# Patient Record
Sex: Female | Born: 1965
Health system: Southern US, Community
[De-identification: ages and names within clinical notes are randomized; demographics above are authoritative.]

## PROBLEM LIST (undated history)

## (undated) DIAGNOSIS — K219 Gastro-esophageal reflux disease without esophagitis: Secondary | ICD-10-CM

## (undated) DIAGNOSIS — M419 Scoliosis, unspecified: Secondary | ICD-10-CM

## (undated) DIAGNOSIS — F419 Anxiety disorder, unspecified: Secondary | ICD-10-CM

## (undated) DIAGNOSIS — Z9889 Other specified postprocedural states: Secondary | ICD-10-CM

## (undated) DIAGNOSIS — L732 Hidradenitis suppurativa: Secondary | ICD-10-CM

## (undated) DIAGNOSIS — I10 Essential (primary) hypertension: Secondary | ICD-10-CM

## (undated) HISTORY — DX: Gastro-esophageal reflux disease without esophagitis: K21.9

## (undated) HISTORY — PX: FRACTURE SURGERY: SHX138

## (undated) HISTORY — DX: Essential (primary) hypertension: I10

## (undated) HISTORY — PX: OTHER SURGICAL HISTORY: SHX169

## (undated) HISTORY — DX: Scoliosis, unspecified: M41.9

## (undated) HISTORY — DX: Anxiety disorder, unspecified: F41.9

## (undated) HISTORY — DX: Hidradenitis suppurativa: L73.2

---

## 2000-05-21 ENCOUNTER — Encounter: Payer: Self-pay | Admitting: *Deleted

## 2000-05-21 ENCOUNTER — Ambulatory Visit (HOSPITAL_COMMUNITY): Admission: RE | Admit: 2000-05-21 | Discharge: 2000-05-21 | Payer: Self-pay | Admitting: *Deleted

## 2000-08-08 ENCOUNTER — Encounter: Payer: Self-pay | Admitting: Emergency Medicine

## 2000-08-08 ENCOUNTER — Inpatient Hospital Stay (HOSPITAL_COMMUNITY): Admission: EM | Admit: 2000-08-08 | Discharge: 2000-08-09 | Payer: Self-pay | Admitting: Emergency Medicine

## 2000-10-15 ENCOUNTER — Encounter (HOSPITAL_COMMUNITY): Admission: RE | Admit: 2000-10-15 | Discharge: 2000-11-14 | Payer: Self-pay | Admitting: Orthopaedic Surgery

## 2000-11-19 ENCOUNTER — Encounter (HOSPITAL_COMMUNITY): Admission: RE | Admit: 2000-11-19 | Discharge: 2000-12-19 | Payer: Self-pay | Admitting: Orthopaedic Surgery

## 2002-06-15 ENCOUNTER — Encounter: Payer: Self-pay | Admitting: Internal Medicine

## 2002-06-15 ENCOUNTER — Inpatient Hospital Stay (HOSPITAL_COMMUNITY): Admission: EM | Admit: 2002-06-15 | Discharge: 2002-06-16 | Payer: Self-pay | Admitting: Internal Medicine

## 2005-09-05 ENCOUNTER — Ambulatory Visit: Payer: Self-pay | Admitting: Psychiatry

## 2005-09-19 ENCOUNTER — Ambulatory Visit (HOSPITAL_COMMUNITY): Payer: Self-pay | Admitting: Psychiatry

## 2005-10-17 ENCOUNTER — Ambulatory Visit (HOSPITAL_COMMUNITY): Payer: Self-pay | Admitting: Psychiatry

## 2005-11-16 ENCOUNTER — Ambulatory Visit (HOSPITAL_COMMUNITY): Payer: Self-pay | Admitting: Psychiatry

## 2005-12-14 ENCOUNTER — Ambulatory Visit (HOSPITAL_COMMUNITY): Payer: Self-pay | Admitting: Psychiatry

## 2006-02-20 ENCOUNTER — Ambulatory Visit (HOSPITAL_COMMUNITY): Payer: Self-pay | Admitting: Psychiatry

## 2006-03-22 ENCOUNTER — Ambulatory Visit (HOSPITAL_COMMUNITY): Payer: Self-pay | Admitting: Psychiatry

## 2006-04-19 ENCOUNTER — Ambulatory Visit (HOSPITAL_COMMUNITY): Payer: Self-pay | Admitting: Psychiatry

## 2006-08-09 ENCOUNTER — Ambulatory Visit (HOSPITAL_COMMUNITY): Payer: Self-pay | Admitting: Psychiatry

## 2006-09-06 ENCOUNTER — Ambulatory Visit (HOSPITAL_COMMUNITY): Payer: Self-pay | Admitting: Psychiatry

## 2007-01-10 ENCOUNTER — Ambulatory Visit (HOSPITAL_COMMUNITY): Payer: Self-pay | Admitting: Psychiatry

## 2007-09-10 ENCOUNTER — Ambulatory Visit (HOSPITAL_COMMUNITY): Payer: Self-pay | Admitting: Psychiatry

## 2007-11-12 ENCOUNTER — Ambulatory Visit (HOSPITAL_COMMUNITY): Payer: Self-pay | Admitting: Psychiatry

## 2009-09-03 ENCOUNTER — Emergency Department (HOSPITAL_COMMUNITY): Admission: EM | Admit: 2009-09-03 | Discharge: 2009-09-03 | Payer: Self-pay | Admitting: Orthopedic Surgery

## 2010-11-25 NOTE — Op Note (Signed)
[place]  Patient:    Patty White, Patty White                        MRN: [id number] Proc. Date: 08/08/00 Adm. Date:  [id number] Attending:  Jacki Cones                           Operative Report  PREOPERATIVE DIAGNOSIS:  Right elbow fracture-dislocation with comminuted ulna fracture, radial head fracture, and radial head dislocation.  POSTOPERATIVE DIAGNOSIS:  Right elbow fracture-dislocation with comminuted ulna fracture, radial head fracture, and radial head dislocation.  PROCEDURE:  Open reduction and internal fixation, right comminuted proximal ulna, open reduction and internal fixation radial head, and closed reduction radial head.  SURGEON:  Mark C. Ophelia Charter, M.D.  ANESTHESIA:  GOT plus Marcaine skin local.  TOURNIQUET TIME:  88 minutes.  DRAINS:  One Hemovac.  DESCRIPTION OF PROCEDURE:  After induction of general anesthesia and orotracheal intubation, patient was placed prone on chest rolls with careful padding and positioning.  Arm board was placed at the side.  Proximal tourniquet was applied and sterile drape and Duraprep, impervious stockinette, Coban.  Arm was wrapped in an Esmarch, tourniquet was inflated.  Midline incision was made posteriorly.  Preoperative Ancef was given before the case and then _____ for several minutes prior to tourniquet inflation.  The subcutaneous tissue was sharply dissected, small bleeders controlled with the Bovie electrocautery.  Subperiosteal dissection on the distal ulna was performed, following the ulna directly along its ulnar border and subperiosteal dissection for exposure.  There were multiple fragments medial and lateral with extreme comminution, and there were 11 pieces counted from the mid-ulna to the proximal tip of the olecranon.  Fortunately, the proximal tip of the olecranon was a rather large piece that was grasped with a towel clip, and piece by piece the ulna was assembled using  0.045 K-wires to hole piece to piece until gradually the ulna was put back in near-anatomic position.  There was a slight displacement of one ulnar fragment, which was anterior and distal.  On the posterior cortex, medial and lateral cortex, it was anatomic, and an eight-hole 3.5 compression plate was placed directly on the ulnar border and proximal and distal screws were placed in neutral using the Green drill guide with cortical screws distally and some cancellous screws proximally.  Interfragmentary screws were used to lag the coronoid process, and some screws were placed from medial to lateral with care taken that they were distal enough so that they would not bother the ulnar nerve, and these were also used to lag the coronoid fragment.  The entire _____ was checked under AP and lateral fluoroscopy.  The radial head was not only dislocated but also had a fracture.  With the extremes of rotation through the operative field along the radial aspect of the ulna, the radial head was visualized where the capsule had been torn.  With hyperpronation, the fracture was identified and using a transulnar pinning technique, the fracture was pinned and then the pin was cut off flush with the cortex of the bone, checked under fluoroscopy, and was in good position.  The pin was ticking off slightly far on the opposite radial side, and the arm was rotated around and the pin was cut at this surface, and then the arm was rotated with pronation-supination with no rub and good position with the elbow flexed.  The  radial head was well-reduced.  With extension, the radial head would sublux posteriorly.  This was easily visualized and with palpation, the radial head would sublux in and out.  After all K-wires had been removed and all interfragmentary and screws were hand-tightened securely, repeated irrigation was used, and periosteum and fascia were closed with 2-0 Vicryl interrupted.  Triceps that was peeled  was also reapproximated over the plate as it attached to the periosteum. Subcutaneous tissue closed with 2-0 Vicryl, Marcaine infiltration of the skin, skin staple closure, application of soft dressing.  A Hemovac was placed prior to closure of the subcutaneous tissue to prevent postoperative hematoma formation.  Radial nerve was palpated in its groove and was normal, and with the elbow flexed at 90 degrees supination-pronation, the radial head was well-reduced in midposition, and it was splinted in this position in 90 degrees flexion, neutral position, pronation-supination, with 4 x 15s, Webril, and Ace wrap.  The patient was transferred to recovery in stable condition, tourniquet time was 88 minutes, was deflated prior to closure. DD:  08/08/00 TD:  08/09/00 Job: [id number]

## 2010-11-25 NOTE — Discharge Summary (Signed)
NAMENOELIA, LENART NO.:  [id number]   MEDICAL RECORD NO.:  [id number]                   PATIENT TYPE:  INP   LOCATION:  A304                                 FACILITY:  [facility]   PHYSICIAN:  Sarita Bottom, M.D.                  DATE OF BIRTH:  1965/12/28   DATE OF ADMISSION:  06/15/2002  DATE OF DISCHARGE:  06/16/2002                                 DISCHARGE SUMMARY   HOSPITAL COURSE:  The patient is a 45 year old lady with a history of  asthma.  She was admitted on June 15, 2002 when she presented to the  emergency room with a complaint of fever, nausea, vomiting, and a cough.  Physical examination on admission was significant for reduced air entry  bilaterally, bilateral expiratory wheezes, and rales - more on the right  side than on the left.  Her admission laboratory tests showed a sodium of  128, potassium 3.2.  Her chest x-ray was significant for a right lower lobe  infiltrate and her UA also showed positive nitrite, positive LE.  The  patient was admitted with an impression of possible community-acquired  pneumonia, UTI, asthma, and influenza; also hyponatremia, hypokalemia, and  anxiety and depression.  She was started with IV Levaquin.  She was  continued on albuterol nebulizers and she was given Flovent MDI.  She was  also given Ativan and IV hydration and IV KCl.  Her clinical course was  significant for improvement in her general medical condition.  Nausea and  vomiting is improved.  She was seen on rounds today.  She had no new  complaints.  She feels much better.   PHYSICAL EXAMINATION:  VITAL SIGNS:  Blood pressure 120/74, temperature 98.0  degrees Fahrenheit.  CHEST:  Clear to auscultation.  CARDIOVASCULAR:  Heart sounds 1 and 2 are normal with regular rhythm and  rate.  ABDOMEN:  Benign.   LABORATORY DATA:  Her latest laboratory shows a wbc of 8.1, neutrophil 70%,  lymphocyte 21%, hemoglobin 9.8, hematocrit 29.5.  Sodium 156,  potassium 3.5.   DISCHARGE DIAGNOSES:  1. Community-acquired pneumonia.  2. Urinary tract infection.  3. Influenza.  4. Asthma.  5. Anxiety and depression.   MEDICATIONS:  The patient will be discharged home on:  1. Oral Levaquin 500 q.d.  2. Continue Tylenol 650 q.6h. p.r.n.  3. Tussionex 30 cc q.12h.  4. Continue albuterol and Flovent MDI.  5. For anxiety, continue with Celexa 20 mg q.d. and Ativan 1 mg q.6h. p.r.n.   DISPOSITION:  The patient will be discharged home to follow up with primary  M.D. within one to two weeks.    DISCHARGE CONDITION:  Stable and satisfactory.   DISPOSITION:  Sarita Bottom, M.D.    DW/MEDQ  D:  06/16/2002  T:  06/16/2002  Job:  [id number]

## 2010-11-25 NOTE — H&P (Signed)
NAMEEUNIQUE, BALIK NO.:  [id number]   MEDICAL RECORD NO.:  [id number]                   PATIENT TYPE:  INP   LOCATION:  A304                                 FACILITY:  [facility]   PHYSICIAN:  Hanley Hays. Dechurch, M.D.           DATE OF BIRTH:  August 13, 1965   DATE OF ADMISSION:  06/15/2002  DATE OF DISCHARGE:                                HISTORY & PHYSICAL   HISTORY OF PRESENT ILLNESS:  A 45 year old with history of asthma who  developed cough, fever, nausea, and vomiting about 3-4 days prior to  presentation.  She complains of being short of breath and weak.  Chest wall  pain secondary to cough but unable to maintain any p.o.  Her primary M.D. is  [facility] in [place].  In the emergency room, she was noted to be  quite uncomfortable.  Chest x-ray does reveal a right lower lobe infiltrate.  White count elevated at 18 with left shift.  Sodium 128, potassium 3.2.  The  patient has not had a flu vaccine this year.  She has not had similar  symptoms.  She has a history of asthma since a child with intermittent  steroid use although none in the last year or so.  She is currently on a  study for a type of inhaler through [facility].  She has not used the inhaler in the last four days.  She normally uses  albuterol and prior to that was using Advair and albuterol.  The patient is  being admitted to the hospital for treatment and evaluation as indicated.   PAST MEDICAL HISTORY:  1. Asthma since childhood.  2. Multiple extrinsic allergies.  3. Migraine headaches.  4. History of olecranon fracture.  5. C-section x2.   FAMILY HISTORY:  Pertinent for asthma.  Her two children also have multiple  environmental allergies as well.   SOCIAL HISTORY:  She is married.  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  No alcohol or tobacco abuse.   REVIEW OF SYSTEMS:  No GI or GU complaints.   Currently with her menses which  is regular.  No change in weight.  No edema.  No rash.  Headaches are  occasional and actually have been stable recently.  She notes diffuse  myalgias at this time consistent with her presenting illness.   ALLERGIES:  1. IODINE  2. SHELLFISH.   No frank medical allergies as far as she knows.   MEDICAL DECISION MAKING:  1. Albuterol nebulizers p.r.n.  2. MDI which is in a double-blinded study apparently.  3. Celexa 20 mg daily.  4. Migraine medication.   PHYSICAL EXAMINATION:  GENERAL:  A moderately overweight Caucasian female  who is comfortable at rest although with frequent cough.  She tends to  hyperventilate.  Somewhat anxious.  VITAL SIGNS:  Temperature 99 although she feels  much warmer to touch.  NECK:  A few small nodes but none of significance.  LUNGS:  Decreased breath sounds throughout.  She has coarse rales, right  greater than left which are diffuse.  Few rhonchi with expiration.  Respiratory rate prior to cough was about 14.  HEART:  Regular, obscured by respiratory noise, about 90.  ABDOMEN:  Soft, nontender, with active bowel sounds.  EXTREMITIES:  Without clubbing, cyanosis, or edema.  SKIN:  Without rash or lesion.  NEUROLOGIC:  Alert and oriented and appropriate mental exam, nonfocal.   LABORATORY DATA:  As noted above.  In addition, glucose 161.  It should be  noted these are not coming up in the computer.  BUN 7, creatinine 0.9,  albumin 3, alkaline phosphatase 135.  Liver functions normal.  Amylase 29,  lipase 23.  White count 18.9, hemoglobin 4, hematocrit 34, platelets 440, 79  segs, 8 bands.  Urine is pertinent for blood but the patient is having  menses.  Beta hCG is negative.   ASSESSMENT AND PLAN:  1. Community-acquired pneumonia in a patient with asthma with a presentation     consistent with the flu.  Plan to do nasal swab for flu just for indexing     purposes; however, she has received empiric Levaquin which we  will     continue in the hospital, continue albuterol nebulizers and add Flovent,     to consider systemic steroids if the cough is not improved.  The plan was     discussed with the patient.  2. Anxiety.  Continue Celexa, p.r.n. Ativan during this hospital stay.  3. Asthma.  Actually, she does not seem to be in any kind of acute distress     from the standpoint of her asthma although this may prolong her symptoms.   The plan was discussed with the patient, who seems to have reasonable  understanding.                                               Hanley Hays [name], M.D.    FED/MEDQ  D:  06/15/2002  T:  06/15/2002  Job:  [id number]

## 2010-11-25 NOTE — H&P (Signed)
   Patty White, [name] NO.:  [id number]   MEDICAL RECORD NO.:  [id number]                   PATIENT TYPE:  INP   LOCATION:  A304                                 FACILITY:  [facility]   PHYSICIAN:  Hanley Hays. Dechurch, M.D.           DATE OF BIRTH:  Nov 17, 1965   DATE OF ADMISSION:  06/15/2002  DATE OF DISCHARGE:                                HISTORY & PHYSICAL   ADDENDUM:   ASSESSMENT AND PLAN:  1. Increased glucose.  Check hemoglobin A1c.  The patient has no history of     diabetes.  2. Hyponatremia, probably on the basis of prerenal and vomiting and     plus/minus lung disease.  In any event, continue intravenous fluids.  3. Mild hypokalemia.  Again, etiology is probably from loss with her     vomiting.  Replete and monitor.                                               Hanley Hays Patty White, M.D.    FED/MEDQ  D:  06/15/2002  T:  06/15/2002  Job:  [id number]

## 2011-03-31 ENCOUNTER — Emergency Department (HOSPITAL_COMMUNITY)
Admission: EM | Admit: 2011-03-31 | Discharge: 2011-03-31 | Disposition: A | Discharge status: Home / Self Care | Payer: Self-pay | Attending: Emergency Medicine | Admitting: Emergency Medicine

## 2011-03-31 ENCOUNTER — Encounter: Payer: Self-pay | Admitting: *Deleted

## 2011-03-31 DIAGNOSIS — Z760 Encounter for issue of repeat prescription: Secondary | ICD-10-CM | POA: Insufficient documentation

## 2011-03-31 DIAGNOSIS — J45909 Unspecified asthma, uncomplicated: Secondary | ICD-10-CM | POA: Insufficient documentation

## 2011-03-31 MED ORDER — ALBUTEROL SULFATE HFA 108 (90 BASE) MCG/ACT IN AERS
INHALATION_SPRAY | RESPIRATORY_TRACT | Status: AC
  Administered 2011-03-31: 17:00:00
  Filled 2011-03-31: qty 6.7

## 2011-03-31 MED ORDER — PREDNISONE 20 MG PO TABS: 40.0000 mg | ORAL_TABLET | Freq: Every day | ORAL | Status: AC

## 2011-03-31 MED ORDER — PREDNISONE 20 MG PO TABS
40.0000 mg | ORAL_TABLET | Freq: Once | ORAL | Status: AC
  Administered 2011-03-31: 40 mg via ORAL
  Filled 2011-03-31: qty 2

## 2011-03-31 MED ORDER — ALBUTEROL SULFATE HFA 108 (90 BASE) MCG/ACT IN AERS: 1.0000 | INHALATION_SPRAY | Freq: Four times a day (QID) | RESPIRATORY_TRACT | Status: DC | PRN

## 2011-03-31 MED ORDER — ALBUTEROL SULFATE (5 MG/ML) 0.5% IN NEBU
2.5000 mg | INHALATION_SOLUTION | Freq: Once | RESPIRATORY_TRACT | Status: AC
  Administered 2011-03-31: 2.5 mg via RESPIRATORY_TRACT
  Filled 2011-03-31: qty 0.5

## 2011-03-31 MED ORDER — SODIUM CHLORIDE 0.9 % IN NEBU
INHALATION_SOLUTION | RESPIRATORY_TRACT | Status: AC
  Administered 2011-03-31: 3 mL
  Filled 2011-03-31: qty 3

## 2011-03-31 NOTE — ED Notes (Signed)
respiratory therapy paged

## 2011-03-31 NOTE — Discharge Instructions (Signed)
Asthma, Adult Asthma is caused by narrowing of the air passages in the lungs. It may be triggered by pollen, dust, animal dander, molds, some foods, respiratory infections, exposure to smoke, exercise, emotional stress or other allergens (things that cause allergic reactions or allergies). Repeat attacks are common. HOME CARE INSTRUCTIONS  Use prescription medications as ordered by your caregiver.   Avoid pollen, dust, animal dander, molds, smoke and other things that cause attacks at home and at work.   You may have fewer attacks if you decrease dust in your home. Electrostatic air cleaners may help.   It may help to replace your pillows or mattress with materials less likely to cause allergies.   Talk to your caregiver about an action plan for managing asthma attacks at home, including, the use of a peak flow meter which measures the severity of your asthma attack. An action plan can help minimize or stop the attack without having to seek medical care.   If you are not on a fluid restriction, drink 8 to 10 glasses of water each day.   Always have a plan prepared for seeking medical attention, including, calling your physician, accessing local emergency care, and calling 911 (in the [place] for a severe attack.   Discuss possible exercise routines with your caregiver.   If animal dander is the cause of asthma, you may need to get rid of pets.  SEEK MEDICAL CARE IF:  You have wheezing and shortness of breath even if taking medicine to prevent attacks.   An oral temperature above 101 develops.   You have muscle aches, chest pain or thickening of sputum.   Your sputum changes from clear or white to yellow, green, gray or bloody.   You have any problems that may be related to the medicine you are taking (such as a rash, itching, swelling or trouble breathing).  SEEK IMMEDIATE MEDICAL CARE IF:  Your usual medicines do not stop your wheezing or there is increased coughing and/or shortness  of breath.   You have increased difficulty breathing.   You have an oral temperature above 101, not controlled by medicine.  MAKE SURE YOU:  Understand these instructions.   Will watch your condition.   Will get help right away if you are not doing well or get worse.  Document Released: 06/26/2005 Document Re-Released: 07/18/2009 [facility] Patient Information 2011 [place], [place].      Take the albuterol and prednisone as directed.  Follow up with your MD as needed.

## 2011-03-31 NOTE — ED Provider Notes (Signed)
History     CSN: [id number] Arrival date & time: 03/31/2011  2:41 PM  Chief Complaint  Patient presents with  . Shortness of Breath    HPI  (Consider location/radiation/quality/duration/timing/severity/associated sxs/prior treatment)  HPI Comments: Pt has no insurance and can't afford to see her MD to renew her albuterol rx.  Patient is a 45 y.o. female presenting with shortness of breath. The history is provided by the patient. No language interpreter was used.  Shortness of Breath  Episode onset: several months ago. The onset was gradual. The problem has been unchanged. The symptoms are relieved by nothing. The symptoms are aggravated by nothing. Associated symptoms include shortness of breath and wheezing. She has not inhaled smoke recently. She has had intermittent steroid use. She has had no prior hospitalizations. She has had no prior ICU admissions. She has had no prior intubations. Her past medical history is significant for asthma and asthma in the family. She has been behaving normally. Urine output has been normal. The last void occurred less than 6 hours ago. There were no sick contacts. She has received no recent medical care.    Past Medical History  Diagnosis Date  . Asthma     Past Surgical History  Procedure Date  . Cesarean section     History reviewed. No pertinent family history.  History  Substance Use Topics  . Smoking status: Never Smoker   . Smokeless tobacco: Not on file  . Alcohol Use: Yes     occasionally    OB History    Grav Para Term Preterm Abortions TAB SAB Ect Mult Living                  Review of Systems  Review of Systems  Respiratory: Positive for chest tightness, shortness of breath and wheezing.   All other systems reviewed and are negative.    Allergies  Iodine and Other  Home Medications   Current Outpatient Rx  Name Route Sig Dispense Refill  . PRIMATENE ASTHMA PO Oral Take 2 tablets by mouth 4 (four) times daily as  needed. Shortness of Breath     . IBUPROFEN 200 MG PO TABS Oral Take 600 mg by mouth every 6 (six) hours as needed. Pain     . OVER THE COUNTER MEDICATION Oral Take 2 tablets by mouth daily. Heartburn Medication     . ALBUTEROL SULFATE HFA 108 (90 BASE) MCG/ACT IN AERS Inhalation Inhale 1-2 puffs into the lungs every 6 (six) hours as needed for wheezing. 1 Inhaler 0    Physical Exam    BP 118/79  Pulse 100  Temp(Src) 97.9 F (36.6 C) (Oral)  Resp 18  Ht 5\' 8"  (1.727 m)  Wt 154 lb (69.854 kg)  BMI 23.42 kg/m2  SpO2 100%  LMP 03/09/2011  Physical Exam  Nursing note and vitals reviewed. Constitutional: She is oriented to person, place, and time. She appears well-developed and well-nourished. No distress.  HENT:  Head: Normocephalic and atraumatic.  Eyes: EOM are normal.  Neck: Normal range of motion.  Cardiovascular: Normal rate, regular rhythm and normal heart sounds.  Exam reveals no gallop and no friction rub.   No murmur heard. Pulmonary/Chest: Effort normal. No respiratory distress. She has wheezes. She has no rales. She exhibits no tenderness.  Abdominal: Soft. She exhibits no distension. There is no tenderness.  Musculoskeletal: Normal range of motion.  Neurological: She is alert and oriented to person, place, and time.  Skin: Skin is warm  and dry. She is not diaphoretic.  Psychiatric: She has a normal mood and affect. Judgment normal.    ED Course  Procedures (including critical care time)  Labs Reviewed - No data to display No results found.   No diagnosis found.   MDM         Worthy Rancher, PA 03/31/11 [phone]

## 2011-03-31 NOTE — ED Notes (Signed)
Pt c/o sob x 1 week. Pt states she has asthma and has had mild rxn. Pt states she has been out of her inhaler for a long time. Pt states she has tried otc meds w/o relief.

## 2011-03-31 NOTE — ED Notes (Signed)
RT returned call and on the way for neb treatment.

## 2011-04-16 NOTE — ED Provider Notes (Signed)
Medical screening examination/treatment/procedure(s) were performed by non-physician practitioner and as supervising physician I was immediately available for consultation/collaboration.  Donnetta Hutching, MD 04/16/11 1536

## 2011-09-11 ENCOUNTER — Encounter (HOSPITAL_COMMUNITY): Payer: Self-pay | Admitting: *Deleted

## 2011-09-11 ENCOUNTER — Emergency Department (HOSPITAL_COMMUNITY)
Admission: EM | Admit: 2011-09-11 | Discharge: 2011-09-11 | Disposition: A | Discharge status: Home / Self Care | Payer: Self-pay | Attending: Emergency Medicine | Admitting: Emergency Medicine

## 2011-09-11 DIAGNOSIS — K0889 Other specified disorders of teeth and supporting structures: Secondary | ICD-10-CM

## 2011-09-11 DIAGNOSIS — K089 Disorder of teeth and supporting structures, unspecified: Secondary | ICD-10-CM | POA: Insufficient documentation

## 2011-09-11 DIAGNOSIS — J45909 Unspecified asthma, uncomplicated: Secondary | ICD-10-CM | POA: Insufficient documentation

## 2011-09-11 MED ORDER — OXYCODONE-ACETAMINOPHEN 5-325 MG PO TABS
2.0000 | ORAL_TABLET | Freq: Once | ORAL | Status: AC
  Administered 2011-09-11: 2 via ORAL
  Filled 2011-09-11: qty 2

## 2011-09-11 MED ORDER — BUPIVACAINE HCL (PF) 0.5 % IJ SOLN
10.0000 mL | Freq: Once | INTRAMUSCULAR | Status: AC
  Administered 2011-09-11: 10 mL
  Filled 2011-09-11: qty 30

## 2011-09-11 MED ORDER — IBUPROFEN 400 MG PO TABS
400.0000 mg | ORAL_TABLET | Freq: Once | ORAL | Status: AC
  Administered 2011-09-11: 400 mg via ORAL
  Filled 2011-09-11: qty 1

## 2011-09-11 MED ORDER — IBUPROFEN 400 MG PO TABS: 400.0000 mg | ORAL_TABLET | Freq: Four times a day (QID) | ORAL | Status: AC | PRN

## 2011-09-11 MED ORDER — OXYCODONE-ACETAMINOPHEN 5-325 MG PO TABS: 1.0000 | ORAL_TABLET | ORAL | Status: AC | PRN

## 2011-09-11 NOTE — ED Notes (Signed)
Pt reports she needs to have a root canal for bottom left molar, reports pain has become severe over the past couple of days

## 2011-09-11 NOTE — ED Provider Notes (Signed)
History    46 year old female presenting with a toothache. Left lower molar. This has been ongoing issue for patient and she is aware of the need for root canal. She reports in the past 2 days the pain has gotten appreciably worse though. Denies trauma. No significant pain anywhere else. No difficulty breathing or swallowing. No fevers or chills. Denies history of diabetes. Has not recently seen a dentist for the same complaint  CSN: [id number]  Arrival date & time 09/11/11  0147   First MD Initiated Contact with Patient 09/11/11 0230      Chief Complaint  Patient presents with  . Dental Pain    (Consider location/radiation/quality/duration/timing/severity/associated sxs/prior treatment) HPI  Past Medical History  Diagnosis Date  . Asthma     Past Surgical History  Procedure Date  . Cesarean section     No family history on file.  History  Substance Use Topics  . Smoking status: Never Smoker   . Smokeless tobacco: Not on file  . Alcohol Use: Yes     occasionally    OB History    Grav Para Term Preterm Abortions TAB SAB Ect Mult Living                  Review of Systems   Review of symptoms negative unless otherwise noted in HPI.   Allergies  Iodine and Other  Home Medications   Current Outpatient Rx  Name Route Sig Dispense Refill  . ALBUTEROL SULFATE HFA 108 (90 BASE) MCG/ACT IN AERS Inhalation Inhale 1-2 puffs into the lungs every 6 (six) hours as needed for wheezing. 1 Inhaler 0  . ALBUTEROL SULFATE HFA 108 (90 BASE) MCG/ACT IN AERS Inhalation Inhale 1-2 puffs into the lungs every 6 (six) hours as needed for wheezing. 1 Inhaler 0  . PRIMATENE ASTHMA PO Oral Take 2 tablets by mouth 4 (four) times daily as needed. Shortness of Breath     . IBUPROFEN 200 MG PO TABS Oral Take 600 mg by mouth every 6 (six) hours as needed. Pain     . OVER THE COUNTER MEDICATION Oral Take 2 tablets by mouth daily. Heartburn Medication       BP 126/79  Pulse 69  Temp(Src)  97.5 F (36.4 C) (Oral)  Resp 20  Ht 5\' 8"  (1.727 m)  Wt 153 lb (69.4 kg)  BMI 23.26 kg/m2  SpO2 99%  LMP 09/04/2011  Physical Exam  Nursing note and vitals reviewed. Constitutional: She appears well-developed and well-nourished. No distress.  HENT:  Head: Normocephalic and atraumatic.  Mouth/Throat: Oropharynx is clear and moist.       Left lower second molar is cracked. There is mild gingivitis but no discrete abscess. Patient is handling her secretions. There is no tongue elevation. Submental tissues are soft. Neck is supple and  without adenopathy.  Eyes: Conjunctivae are normal. Right eye exhibits no discharge. Left eye exhibits no discharge.  Neck: Neck supple.  Cardiovascular: Normal rate, regular rhythm and normal heart sounds.  Exam reveals no gallop and no friction rub.   No murmur heard. Pulmonary/Chest: Effort normal and breath sounds normal. No respiratory distress.  Musculoskeletal: She exhibits no edema and no tenderness.  Neurological: She is alert.  Skin: Skin is warm and dry.  Psychiatric: She has a normal mood and affect. Her behavior is normal. Thought content normal.    ED Course  NERVE BLOCK Date/Time: 09/11/2011 3:00 AM Performed by: Raeford Razor Authorized by: Raeford Razor Consent: Verbal consent obtained.  Written consent not obtained. Risks and benefits: risks, benefits and alternatives were discussed Consent given by: patient Patient identity confirmed: verbally with patient and provided demographic data Indications: pain relief Body area: face/mouth Nerve: inferior alveolar Laterality: left Patient sedated: no Patient position: sitting Needle gauge: 25 G Location technique: anatomical landmarks Local anesthetic: bupivacaine 0.5% with epinephrine Anesthetic total: 2 ml Outcome: pain improved   (including critical care time)  Labs Reviewed - No data to display No results found.   1. Toothache       MDM    46year-old female with  dental plan. Patient has a crack second lower molar. There is no evidence of discrete drainable collection. Patient is afebrile and clinically well appearing. There is no evidence of deep space infection. Patient was agreeable to nerve block. Inferior alveolar block was performed with good result analgesia.  Dental referral was provided. Outpatient followup.        Raeford Razor, MD 09/18/11 [phone]

## 2011-09-11 NOTE — Discharge Instructions (Signed)
Dental Pain Toothache is pain in or around a tooth. It may get worse with chewing or with cold or heat.  HOME CARE  Your dentist may use a numbing medicine during treatment. If so, you may need to avoid eating until the medicine wears off. Ask your dentist about this.   Only take medicine as told by your dentist or doctor.   Avoid chewing food near the painful tooth until after all treatment is done. Ask your dentist about this.  GET HELP RIGHT AWAY IF:   The problem gets worse or new problems appear.   You have a fever.   There is redness and puffiness (swelling) of the face, jaw, or neck.   You cannot open your mouth.   There is pain in the jaw.   There is very bad pain that is not helped by medicine.  MAKE SURE YOU:   Understand these instructions.   Will watch your condition.   Will get help right away if you are not doing well or get worse.  Document Released: 12/13/2007 Document Revised: 06/15/2011 Document Reviewed: 12/13/2007 [facility] Patient Information 2012 [place], [place].Toothache Toothaches are usually caused by tooth decay (cavity). However, other causes of toothache include:  Gum disease.   Cracked tooth.   Cracked filling.   Injury.   Jaw problem (temporo mandibular joint or TMJ disorder).   Tooth abscess.   Root sensitivity.   Grinding.   Eruption problems.  Swelling and redness around a painful tooth often means you have a dental abscess. Pain medicine and antibiotics can help reduce symptoms, but you will need to see a dentist within the next few days to have your problem properly evaluated and treated. If tooth decay is the problem, you may need a filling or root canal to save your tooth. If the problem is more severe, your tooth may need to be pulled. SEEK IMMEDIATE MEDICAL CARE IF:  You cannot swallow.   You develop severe swelling, increased redness, or increased pain in your mouth or face.   You have a fever.   You cannot open your  mouth adequately.  Document Released: 08/03/2004 Document Revised: 06/15/2011 Document Reviewed: 09/23/2009 [facility] Patient Information 2012 [place], [place].  RESOURCE GUIDE  Dental Problems  Patients with Medicaid: [facility] [phone] [address]                                           [phone] [address] Phone:  [phone]                                                  Phone:  [phone]  If unable to pay or uninsured, contact:  [facility] or [facility]. to become qualified for the adult dental clinic.  Chronic Pain Problems Contact [facility]  [phone] Patients need to be referred by their primary care doctor.  Insufficient Money for Medicine Contact [employer]:  call "211" or [facility] 251-076-9332.  No Primary Care Doctor Call [facility]  [phone] Other agencies that provide inexpensive medical care  [facility]  [phone]    [facility]  [phone]    [facility]  [phone]    [facility]  [phone]    [facility]  [phone]    [facility]  [phone]  Psychological Services [facility]  [phone] [facility]  [phone] [facility]   [phone] (emergency services [phone])  Substance Abuse Resources [facility]  [phone] [facility] [phone] [phone] [facility] [phone] Floydene Flock [phone] Residential & Outpatient Substance Abuse Program  (604) [phone]  Abuse/Neglect [facility] Child Abuse Hotline 713-814-7346 [facility] Child Abuse Hotline 514-232-4564 (After Hours)  Emergency Shelter [facility] [phone]  Maternity Homes [facility] [place] [facility] 352-072-2647 [facility] [phone]  MRSA Hotline #:   (517) 862-9539    [facility] Resources  Free  [facility] [place]     [employer]                          [facility]. [address] [place]                       [address]      [address]  Blondell Reveal Phone:  [phone]                                   Phone:  [phone]                 Phone:  [phone]  [facility] Phone:  [phone]  [facility] Child Abuse Hotline (214) 387-1189 [phone] (After Hours)

## 2011-10-03 ENCOUNTER — Emergency Department (HOSPITAL_COMMUNITY)
Admission: EM | Admit: 2011-10-03 | Discharge: 2011-10-03 | Disposition: A | Discharge status: Home Health | Payer: Self-pay | Attending: Emergency Medicine | Admitting: Emergency Medicine

## 2011-10-03 ENCOUNTER — Encounter (HOSPITAL_COMMUNITY): Payer: Self-pay | Admitting: *Deleted

## 2011-10-03 DIAGNOSIS — K029 Dental caries, unspecified: Secondary | ICD-10-CM | POA: Insufficient documentation

## 2011-10-03 DIAGNOSIS — K0889 Other specified disorders of teeth and supporting structures: Secondary | ICD-10-CM

## 2011-10-03 DIAGNOSIS — J45909 Unspecified asthma, uncomplicated: Secondary | ICD-10-CM | POA: Insufficient documentation

## 2011-10-03 MED ORDER — PENICILLIN V POTASSIUM 500 MG PO TABS: 500.0000 mg | ORAL_TABLET | Freq: Four times a day (QID) | ORAL | Status: AC

## 2011-10-03 MED ORDER — PENICILLIN V POTASSIUM 250 MG PO TABS
500.0000 mg | ORAL_TABLET | Freq: Once | ORAL | Status: DC
  Filled 2011-10-03: qty 2

## 2011-10-03 MED ORDER — HYDROCODONE-ACETAMINOPHEN 5-325 MG PO TABS: 1.0000 | ORAL_TABLET | Freq: Four times a day (QID) | ORAL | Status: AC | PRN

## 2011-10-03 MED ORDER — HYDROCODONE-ACETAMINOPHEN 5-325 MG PO TABS
1.0000 | ORAL_TABLET | Freq: Once | ORAL | Status: DC
  Filled 2011-10-03: qty 1

## 2011-10-03 NOTE — ED Notes (Signed)
Pt DC to home with steady gait and info on dental clinics.

## 2011-10-03 NOTE — ED Provider Notes (Signed)
History     CSN: [id number]  Arrival date & time 10/03/11  1500   First MD Initiated Contact with Patient 10/03/11 1720      Chief Complaint  Patient presents with  . Dental Pain    (Consider location/radiation/quality/duration/timing/severity/associated sxs/prior treatment) Patient is a 46 y.o. female presenting with tooth pain. The history is provided by the patient. No language interpreter was used.  Dental PainThe primary symptoms include mouth pain. Primary symptoms do not include dental injury. Episode onset: hurting for 6 weeks.   The symptoms are unchanged. The symptoms occur constantly.  Additional symptoms include: dental sensitivity to temperature and jaw pain. Additional symptoms do not include: trismus.    Past Medical History  Diagnosis Date  . Asthma     Past Surgical History  Procedure Date  . Cesarean section     No family history on file.  History  Substance Use Topics  . Smoking status: Never Smoker   . Smokeless tobacco: Not on file  . Alcohol Use: Yes     occasionally    OB History    Grav Para Term Preterm Abortions TAB SAB Ect Mult Living                  Review of Systems  HENT: Positive for dental problem.   All other systems reviewed and are negative.    Allergies  Iodine and Other  Home Medications   Current Outpatient Rx  Name Route Sig Dispense Refill  . ALBUTEROL SULFATE HFA 108 (90 BASE) MCG/ACT IN AERS Inhalation Inhale 1-2 puffs into the lungs every 6 (six) hours as needed for wheezing. 1 Inhaler 0  . ASPIRIN-ACETAMINOPHEN-CAFFEINE 250-250-65 MG PO TABS Oral Take 1 tablet by mouth as needed. For pain    . GOODY HEADACHE PO Oral Take 1 packet by mouth as needed. For pain    . IBUPROFEN 200 MG PO TABS Oral Take 600 mg by mouth every 6 (six) hours as needed. Pain     . NAPROXEN SODIUM 220 MG PO TABS Oral Take 220 mg by mouth as needed. For pain    . OMEPRAZOLE 20 MG PO CPDR Oral Take 20 mg by mouth at bedtime.    Marland Kitchen  HYDROCODONE-ACETAMINOPHEN 5-325 MG PO TABS Oral Take 1 tablet by mouth every 6 (six) hours as needed for pain. 20 tablet 0  . PENICILLIN V POTASSIUM 500 MG PO TABS Oral Take 1 tablet (500 mg total) by mouth 4 (four) times daily. 40 tablet 0    BP 147/84  Pulse 80  Temp(Src) 97.9 F (36.6 C) (Oral)  Resp 20  Ht 5\' 8"  (1.727 m)  Wt 153 lb (69.4 kg)  BMI 23.26 kg/m2  SpO2 100%  LMP 09/04/2011  Physical Exam  Nursing note and vitals reviewed. Constitutional: She is oriented to person, place, and time. She appears well-developed and well-nourished. No distress.  HENT:  Head: Normocephalic and atraumatic.  Mouth/Throat: Uvula is midline, oropharynx is clear and moist and mucous membranes are normal. Abnormal dentition. Dental caries present. No uvula swelling.    Eyes: EOM are normal.  Neck: Normal range of motion.  Cardiovascular: Normal rate, regular rhythm and normal heart sounds.   Pulmonary/Chest: Effort normal and breath sounds normal.  Abdominal: Soft. She exhibits no distension. There is no tenderness.  Musculoskeletal: Normal range of motion.  Neurological: She is alert and oriented to person, place, and time.  Skin: Skin is warm and dry.  Psychiatric: She has a  normal mood and affect. Judgment normal.    ED Course  Procedures (including critical care time)  Labs Reviewed - No data to display No results found.   1. Pain, dental       MDM  rx-pen VK, 40 rx hydrocodone, 20 OTC  Ibuprofen TID F/u with dentist of choice.        Worthy Rancher, PA 10/03/11 1733  Worthy Rancher, PA 10/03/11 1736

## 2011-10-03 NOTE — Discharge Instructions (Signed)
Dental Pain A tooth ache may be caused by cavities (tooth decay). Cavities expose the nerve of the tooth to air and hot or cold temperatures. It may come from an infection or abscess (also called a boil or furuncle) around your tooth. It is also often caused by dental caries (tooth decay). This causes the pain you are having. DIAGNOSIS  Your caregiver can diagnose this problem by exam. TREATMENT   If caused by an infection, it may be treated with medications which kill germs (antibiotics) and pain medications as prescribed by your caregiver. Take medications as directed.   Only take over-the-counter or prescription medicines for pain, discomfort, or fever as directed by your caregiver.   Whether the tooth ache today is caused by infection or dental disease, you should see your dentist as soon as possible for further care.  SEEK MEDICAL CARE IF: The exam and treatment you received today has been provided on an emergency basis only. This is not a substitute for complete medical or dental care. If your problem worsens or new problems (symptoms) appear, and you are unable to meet with your dentist, call or return to this location. SEEK IMMEDIATE MEDICAL CARE IF:   You have a fever.   You develop redness and swelling of your face, jaw, or neck.   You are unable to open your mouth.   You have severe pain uncontrolled by pain medicine.  MAKE SURE YOU:   Understand these instructions.   Will watch your condition.   Will get help right away if you are not doing well or get worse.  Document Released: 06/26/2005 Document Revised: 06/15/2011 Document Reviewed: 02/12/2008 [employer] Patient Information 2012 [employer].   Take the meds as directed.  Take ibuprofen up to 800 mg every 8 hrs with food.  Follow up with the dentist of your choice. 

## 2011-10-03 NOTE — ED Notes (Signed)
Pt presents with left lower dental pain x 1 week. No swelling noted.

## 2011-10-07 NOTE — ED Provider Notes (Signed)
Medical screening examination/treatment/procedure(s) were performed by non-physician practitioner and as supervising physician I was immediately available for consultation/collaboration.  Flint Melter, MD 10/07/11 [phone]

## 2012-05-07 ENCOUNTER — Emergency Department (HOSPITAL_COMMUNITY)
Admission: EM | Admit: 2012-05-07 | Discharge: 2012-05-07 | Disposition: A | Discharge status: Home / Self Care | Payer: Self-pay | Attending: Emergency Medicine | Admitting: Emergency Medicine

## 2012-05-07 ENCOUNTER — Encounter (HOSPITAL_COMMUNITY): Payer: Self-pay | Admitting: *Deleted

## 2012-05-07 DIAGNOSIS — R062 Wheezing: Secondary | ICD-10-CM | POA: Insufficient documentation

## 2012-05-07 DIAGNOSIS — X500XXA Overexertion from strenuous movement or load, initial encounter: Secondary | ICD-10-CM | POA: Insufficient documentation

## 2012-05-07 DIAGNOSIS — S5011XA Contusion of right forearm, initial encounter: Secondary | ICD-10-CM

## 2012-05-07 DIAGNOSIS — Y9383 Activity, rough housing and horseplay: Secondary | ICD-10-CM | POA: Insufficient documentation

## 2012-05-07 DIAGNOSIS — R0789 Other chest pain: Secondary | ICD-10-CM | POA: Insufficient documentation

## 2012-05-07 DIAGNOSIS — Y929 Unspecified place or not applicable: Secondary | ICD-10-CM | POA: Insufficient documentation

## 2012-05-07 DIAGNOSIS — J45909 Unspecified asthma, uncomplicated: Secondary | ICD-10-CM | POA: Insufficient documentation

## 2012-05-07 DIAGNOSIS — S5010XA Contusion of unspecified forearm, initial encounter: Secondary | ICD-10-CM | POA: Insufficient documentation

## 2012-05-07 MED ORDER — ALBUTEROL SULFATE HFA 108 (90 BASE) MCG/ACT IN AERS: INHALATION_SPRAY | RESPIRATORY_TRACT | Status: AC

## 2012-05-07 MED ORDER — ALBUTEROL SULFATE HFA 108 (90 BASE) MCG/ACT IN AERS
2.0000 | INHALATION_SPRAY | RESPIRATORY_TRACT | Status: DC | PRN
  Administered 2012-05-07: 2 via RESPIRATORY_TRACT
  Filled 2012-05-07: qty 6.7

## 2012-05-07 NOTE — ED Provider Notes (Signed)
History     CSN: [id number]  Arrival date & time 05/07/12  1553   First MD Initiated Contact with Patient 05/07/12 1624      Chief Complaint  Patient presents with  . Arm Pain    (Consider location/radiation/quality/duration/timing/severity/associated sxs/prior treatment) HPI Comments: Pt states she and her husband were"playing around and he twisted my R arm".  She ORIF to R forearm many years ago.  She notes swelling to ulnar aspect of mid forearm.  Pain with movement.  R hand dominant.  "i don't want any x-rays b/c they are so expensive".  Also, she has asthma and her cat is exacerbating the chest tightness.  Denies cough or fever.    Patient is a 46 y.o. female presenting with arm pain. The history is provided by the patient. No language interpreter was used.  Arm Pain This is a new problem. The current episode started today. The problem occurs constantly. Pertinent negatives include no chills, coughing or fever. Exacerbated by: movement and palpation. She has tried nothing for the symptoms.    Past Medical History  Diagnosis Date  . Asthma     Past Surgical History  Procedure Date  . Cesarean section   . Arm surgery     History reviewed. No pertinent family history.  History  Substance Use Topics  . Smoking status: Never Smoker   . Smokeless tobacco: Not on file  . Alcohol Use: Yes     occasionally    OB History    Grav Para Term Preterm Abortions TAB SAB Ect Mult Living                  Review of Systems  Constitutional: Negative for fever and chills.  Respiratory: Positive for chest tightness and wheezing. Negative for cough and shortness of breath.   Skin: Negative for wound.  All other systems reviewed and are negative.    Allergies  Iodine and Other  Home Medications   Current Outpatient Rx  Name Route Sig Dispense Refill  . ALBUTEROL SULFATE HFA 108 (90 BASE) MCG/ACT IN AERS Inhalation Inhale 1-2 puffs into the lungs every 6 (six) hours as  needed for wheezing. 1 Inhaler 0  . ALBUTEROL SULFATE HFA 108 (90 BASE) MCG/ACT IN AERS  1-2 puffs q 4-6 hrs 1 Inhaler 0  . ASPIRIN-ACETAMINOPHEN-CAFFEINE 250-250-65 MG PO TABS Oral Take 1 tablet by mouth as needed. For pain    . GOODY HEADACHE PO Oral Take 1 packet by mouth as needed. For pain    . IBUPROFEN 200 MG PO TABS Oral Take 600 mg by mouth every 6 (six) hours as needed. Pain     . NAPROXEN SODIUM 220 MG PO TABS Oral Take 220 mg by mouth as needed. For pain    . OMEPRAZOLE 20 MG PO CPDR Oral Take 20 mg by mouth at bedtime.      BP 128/88  Pulse 99  Temp 98.2 F (36.8 C) (Oral)  Resp 18  Ht 5\' 8"  (1.727 m)  Wt 155 lb (70.308 kg)  BMI 23.57 kg/m2  SpO2 100%  LMP 04/23/2012  Physical Exam  Nursing note and vitals reviewed. Constitutional: She is oriented to person, place, and time. She appears well-developed and well-nourished. No distress.  HENT:  Head: Normocephalic and atraumatic.  Eyes: EOM are normal.  Neck: Normal range of motion.  Cardiovascular: Normal rate and regular rhythm.   Pulmonary/Chest: Effort normal. No accessory muscle usage. Not tachypneic. No respiratory distress. She has  no decreased breath sounds. She has wheezes. She has no rhonchi. She has no rales. She exhibits no tenderness.       Scattered wheezes noted.  Abdominal: Soft. She exhibits no distension. There is no tenderness.  Musculoskeletal: Normal range of motion. She exhibits tenderness.       Arms: Neurological: She is alert and oriented to person, place, and time.  Skin: Skin is warm and dry.  Psychiatric: She has a normal mood and affect. Judgment normal.    ED Course  Procedures (including critical care time)  Labs Reviewed - No data to display No results found.   1. Contusion of right forearm   2. Asthma       MDM  Pt does not want an x-ray b/c of cost.  She will return if still having significantl pain in a few days.     Sling, ice, elevation Albuterol HFA given to her  and rx for one more.        Evalina Field, [place] 05/07/12 [phone]

## 2012-05-07 NOTE — ED Notes (Signed)
Pt presents with c/o rt arm  Pain after "horse playing" . No deformity noted. Pt has had previous surgery to Rt arm. Pt has full ROM to rt arm. Pt also has history of asthma, r/t need for inhaler.

## 2012-05-07 NOTE — ED Notes (Signed)
Rt arm swelling and pain after twisted by another person.  Also says her asthma is bothering her a little.

## 2012-05-09 NOTE — ED Provider Notes (Signed)
Medical screening examination/treatment/procedure(s) were performed by non-physician practitioner and as supervising physician I was immediately available for consultation/collaboration.   Tajon Moring M Retaj Hilbun, DO 05/09/12 2104 

## 2013-03-13 ENCOUNTER — Other Ambulatory Visit (HOSPITAL_COMMUNITY): Payer: Self-pay | Admitting: Nurse Practitioner

## 2013-03-13 DIAGNOSIS — Z139 Encounter for screening, unspecified: Secondary | ICD-10-CM

## 2013-03-20 ENCOUNTER — Ambulatory Visit (HOSPITAL_COMMUNITY)
Admission: RE | Admit: 2013-03-20 | Discharge: 2013-03-20 | Disposition: A | Discharge status: Home / Self Care | Payer: Self-pay | Source: Ambulatory Visit | Attending: Nurse Practitioner | Admitting: Nurse Practitioner

## 2013-03-20 DIAGNOSIS — Z139 Encounter for screening, unspecified: Secondary | ICD-10-CM

## 2014-05-31 ENCOUNTER — Emergency Department (HOSPITAL_COMMUNITY)
Admission: EM | Admit: 2014-05-31 | Discharge: 2014-05-31 | Disposition: A | Discharge status: Home / Self Care | Payer: Self-pay | Attending: Emergency Medicine | Admitting: Emergency Medicine

## 2014-05-31 ENCOUNTER — Encounter (HOSPITAL_COMMUNITY): Payer: Self-pay | Admitting: Emergency Medicine

## 2014-05-31 ENCOUNTER — Emergency Department (HOSPITAL_COMMUNITY): Payer: Self-pay

## 2014-05-31 DIAGNOSIS — Z79899 Other long term (current) drug therapy: Secondary | ICD-10-CM | POA: Insufficient documentation

## 2014-05-31 DIAGNOSIS — W1839XA Other fall on same level, initial encounter: Secondary | ICD-10-CM | POA: Insufficient documentation

## 2014-05-31 DIAGNOSIS — Y9389 Activity, other specified: Secondary | ICD-10-CM | POA: Insufficient documentation

## 2014-05-31 DIAGNOSIS — Y9289 Other specified places as the place of occurrence of the external cause: Secondary | ICD-10-CM | POA: Insufficient documentation

## 2014-05-31 DIAGNOSIS — S7001XA Contusion of right hip, initial encounter: Secondary | ICD-10-CM | POA: Insufficient documentation

## 2014-05-31 DIAGNOSIS — Y998 Other external cause status: Secondary | ICD-10-CM | POA: Insufficient documentation

## 2014-05-31 DIAGNOSIS — Z791 Long term (current) use of non-steroidal anti-inflammatories (NSAID): Secondary | ICD-10-CM | POA: Insufficient documentation

## 2014-05-31 DIAGNOSIS — W19XXXA Unspecified fall, initial encounter: Secondary | ICD-10-CM

## 2014-05-31 DIAGNOSIS — J45909 Unspecified asthma, uncomplicated: Secondary | ICD-10-CM | POA: Insufficient documentation

## 2014-05-31 MED ORDER — NAPROXEN 500 MG PO TABS: 500.0000 mg | ORAL_TABLET | Freq: Two times a day (BID) | ORAL | Status: DC

## 2014-05-31 MED ORDER — HYDROCODONE-ACETAMINOPHEN 5-325 MG PO TABS: ORAL_TABLET | ORAL | Status: DC

## 2014-05-31 NOTE — Discharge Instructions (Signed)
Contusion °A contusion is a deep bruise. Contusions happen when an injury causes bleeding under the skin. Signs of bruising include pain, puffiness (swelling), and discolored skin. The contusion may turn blue, purple, or yellow. °HOME CARE  °· Put ice on the injured area. °¨ Put ice in a plastic bag. °¨ Place a towel between your skin and the bag. °¨ Leave the ice on for 15-20 minutes, 03-04 times a day. °· Only take medicine as told by your doctor. °· Rest the injured area. °· If possible, raise (elevate) the injured area to lessen puffiness. °GET HELP RIGHT AWAY IF:  °· You have more bruising or puffiness. °· You have pain that is getting worse. °· Your puffiness or pain is not helped by medicine. °MAKE SURE YOU:  °· Understand these instructions. °· Will watch your condition. °· Will get help right away if you are not doing well or get worse. °Document Released: 12/13/2007 Document Revised: 09/18/2011 Document Reviewed: 05/01/2011 °[employer]® Patient Information ©2015 [employer]. This information is not intended to replace advice given to you by your health care provider. Make sure you discuss any questions you have with your health care provider. ° °Hematoma °A hematoma is a collection of blood. The collection of blood can turn into a hard, painful lump under the skin. Your skin may turn blue or yellow if the hematoma is close to the surface of the skin. Most hematomas get better in a few days to weeks. Some hematomas are serious and need medical care. Hematomas can be very small or very big. °HOME CARE °· Apply ice to the injured area: °¨ Put ice in a plastic bag. °¨ Place a towel between your skin and the bag. °¨ Leave the ice on for 20 minutes, 2-3 times a day for the first 1 to 2 days. °· After the first 2 days, switch to using warm packs on the injured area. °· Raise (elevate) the injured area to lessen pain and puffiness (swelling). You may also wrap the area with an elastic bandage. Make sure the  bandage is not wrapped too tight. °· If you have a painful hematoma on your leg or foot, you may use crutches for a couple days. °· Only take medicines as told by your doctor. °GET HELP RIGHT AWAY IF:  °· Your pain gets worse. °· Your pain is not controlled with medicine. °· You have a fever. °· Your puffiness gets worse. °· Your skin turns more blue or yellow. °· Your skin over the hematoma breaks or starts bleeding. °· Your hematoma is in your chest or belly (abdomen) and you are short of breath, feel weak, or have a change in consciousness. °· Your hematoma is on your scalp and you have a headache that gets worse or a change in alertness or consciousness. °MAKE SURE YOU:  °· Understand these instructions. °· Will watch your condition. °· Will get help right away if you are not doing well or get worse. °Document Released: 08/03/2004 Document Revised: 02/26/2013 Document Reviewed: 12/04/2012 °[employer]® Patient Information ©2015 [employer]. This information is not intended to replace advice given to you by your health care provider. Make sure you discuss any questions you have with your health care provider. ° °

## 2014-05-31 NOTE — ED Provider Notes (Signed)
CSN: [id number]     Arrival date & time 05/31/14  1126 History  This chart was scribed for [name], PA-C with Ward GivensIva L Knapp, MD by Tonye RoyaltyJoshua Chen, ED Scribe. This patient was seen in room APFT23/APFT23 and the patient's care was started at 12:11 PM.    Chief Complaint  Patient presents with  . Hip Pain   The history is provided by the patient. No language interpreter was used.    HPI Comments: Patty White is a 48 y.o. female who presents to the Emergency Department complaining of right hip pain status post falling and landing on it in a parking garage 1 week ago. She reports bruising and a knot to the affected area. She states bruising and swelling began right after falling; she notes pain and swelling has decreased, but the knot is persistent. She states pain is worse with movement, weight bearing, or applied pressure, but has been able to ambulate without difficulty. She states she has been using Goody's Powder, Ibuprofen, Aleve, and Tylenol for her pain. She denies chronic use of OTC pain medications. She has applied ice as well. She denies numbness, fever, chills, back pain or abdominal pain.    Past Medical History  Diagnosis Date  . Asthma    Past Surgical History  Procedure Laterality Date  . Cesarean section    . Arm surgery     Family History  Problem Relation Age of Onset  . Cancer Other    History  Substance Use Topics  . Smoking status: Never Smoker   . Smokeless tobacco: Never Used  . Alcohol Use: Yes     Comment: occasionally   OB History    Gravida Para Term Preterm AB TAB SAB Ectopic Multiple Living   2 2 2       2      Review of Systems  Constitutional: Negative for fever and chills.  Gastrointestinal: Negative for nausea, vomiting and abdominal pain.  Genitourinary: Negative for dysuria, hematuria, flank pain and difficulty urinating.  Musculoskeletal: Positive for myalgias. Negative for neck pain and neck stiffness. Arthralgias: right hip.  Skin:  Negative for color change and wound.       Bruising and knot to right hip  Neurological: Negative for dizziness, weakness and numbness.  All other systems reviewed and are negative.     Allergies  Iodine and Other  Home Medications   Prior to Admission medications   Medication Sig Start Date End Date Taking? Authorizing Provider  albuterol (PROVENTIL HFA;VENTOLIN HFA) 108 (90 BASE) MCG/ACT inhaler Inhale 1-2 puffs into the lungs every 6 (six) hours as needed for wheezing. 03/31/11 03/30/12  Evalina Fieldichard Miller, PA-C  albuterol (PROVENTIL HFA;VENTOLIN HFA) 108 (90 BASE) MCG/ACT inhaler 1-2 puffs q 4-6 hrs 05/07/12   Evalina Fieldichard Miller, PA-C  aspirin-acetaminophen-caffeine (EXCEDRIN EXTRA STRENGTH) [phone] MG per tablet Take 1 tablet by mouth as needed. For pain    Historical Provider, MD  Aspirin-Acetaminophen-Caffeine (GOODY HEADACHE PO) Take 1 packet by mouth as needed. For pain    Historical Provider, MD  ibuprofen (ADVIL,MOTRIN) 200 MG tablet Take 600 mg by mouth every 6 (six) hours as needed. Pain     Historical Provider, MD  naproxen sodium (ANAPROX) 220 MG tablet Take 220 mg by mouth as needed. For pain    Historical Provider, MD  omeprazole (PRILOSEC) 20 MG capsule Take 20 mg by mouth at bedtime.    Historical Provider, MD   BP 167/95 mmHg  Pulse 82  Temp(Src) 97.9 F (  36.6 C) (Oral)  Resp 18  SpO2 100%  LMP 05/31/2014 Physical Exam  Constitutional: She is oriented to person, place, and time. She appears well-developed and well-nourished.  HENT:  Head: Normocephalic and atraumatic.  Eyes: Conjunctivae are normal.  Neck: Normal range of motion. Neck supple.  Cardiovascular: Normal rate, regular rhythm and normal heart sounds.   No murmur heard. Pulmonary/Chest: Effort normal and breath sounds normal. No respiratory distress. She has no wheezes. She has no rales.  Musculoskeletal: Normal range of motion.  Large area of ecchymosis to lateral right hip extending to the posterior  upper leg. Lateral hematoma present.  DP pulses and distal sensation intact Patient has full ROM of the hip.  Compartments of the right LE are soft.   Neurological: She is alert and oriented to person, place, and time. She has normal strength. No sensory deficit. Gait normal.  Skin: Skin is warm and dry.  Psychiatric: She has a normal mood and affect.  Nursing note and vitals reviewed.   ED Course  Procedures (including critical care time)  DIAGNOSTIC STUDIES: Oxygen Saturation is 100% on room air, normal by my interpretation.    COORDINATION OF CARE: 12:19 PM Discussed treatment plan with patient at beside, including x-ray. Counseled her on keeping weight off it and use of crutches. The patient agrees with the plan and has no further questions at this time.   Labs Review Labs Reviewed - No data to display  Imaging Review Dg Hip Complete Right  05/31/2014   CLINICAL DATA:  Larey SeatFell 7 days ago and parking garage, lateral RIGHT hip bruising and swelling  EXAM: RIGHT HIP - COMPLETE 2+ VIEW  COMPARISON:  None  FINDINGS: Osseous mineralization normal.  Probable medication tablet projects over the RIGHT sacrum.  Minimal osteitis pubis.  Hip and SI joints symmetric.  No acute fracture, subluxation, or bone destruction.  Degenerative disc disease changes lower lumbar spine.  Soft tissue swelling laterally at RIGHT hip region.  IMPRESSION: No acute RIGHT hip osseous abnormalities.   Electronically Signed   By: Ulyses SouthwardMark  Boles M.D.   On: 05/31/2014 13:01     EKG Interpretation None      MDM   Final diagnoses:  Fall  Hematoma of hip, right, initial encounter    Pt is ambulatory, has full ROM of the hip.  NV intact, compartments of the right LE are soft.  Pt agrees to ice, rest and close f/u with PMD for recheck.  I personally performed the services described in this documentation, which was scribed in my presence. The recorded information has been reviewed and is accurate.    Josia Cueva L.  Trisha Mangleriplett, PA-C 06/02/14 2036  Ward GivensIva L Knapp, MD 06/08/14 [phone]

## 2014-05-31 NOTE — ED Notes (Addendum)
Patient c/o right hip pain after falling x1 week ago. Patient reports pain constant worse with pressure. Patient ambulatory in triage. Large contusion noted to right hip down right leg. Patient reports "knot" on right hip.

## 2014-05-31 NOTE — Progress Notes (Signed)
ED/CM noted patient did not have health insurance and/or PCP listed in the computer.  Patient was given the [place] resources handout with information on the clinics, food pantries, and the handout for new health insurance sign-up. Provided drug discount card. Patient expressed appreciation for information received.   

## 2015-10-20 ENCOUNTER — Ambulatory Visit (HOSPITAL_COMMUNITY): Payer: 59 | Attending: Orthopedic Surgery

## 2015-10-20 ENCOUNTER — Encounter (HOSPITAL_COMMUNITY): Payer: Self-pay

## 2015-10-20 DIAGNOSIS — M546 Pain in thoracic spine: Secondary | ICD-10-CM | POA: Insufficient documentation

## 2015-10-20 DIAGNOSIS — R293 Abnormal posture: Secondary | ICD-10-CM

## 2015-10-20 DIAGNOSIS — M545 Low back pain, unspecified: Secondary | ICD-10-CM

## 2015-10-20 DIAGNOSIS — M6281 Muscle weakness (generalized): Secondary | ICD-10-CM | POA: Insufficient documentation

## 2015-10-20 NOTE — Patient Instructions (Addendum)
   Quadratus Lumborum stretch/Scoliosis Stretch  Standing, feet together, Lift Lt. arm overhead, grasp Lt. wrist with Rt. hand and gently pull towards the Rt. side to stretch the Lt. Quadratus Lumborum.     Complete 10 times, hold each stretch for 5 sec, 2-3x/day  Try to progress to 3 sets of 30 seconds once tolerable           SCAPULAR RETRACTIONS  Draw your shoulder blades back and down.  Complete 15-20 reps, hold 5 sec and then relax, complete 2-3 sets a day

## 2015-10-20 NOTE — Therapy (Signed)
[facility] [address] [place], [place], [address] Phone: [phone]   Fax:  [phone]  Physical Therapy Evaluation  Patient Details  Name: Patty White MRN: [id number] Date of Birth: 09/23/1965 Referring Provider: Dr. Malachy Chamberuben Torrealba  Encounter Date: 10/20/2015      PT End of Session - 10/20/15 1844    Visit Number 1   Number of Visits 13   Date for PT Re-Evaluation 11/19/15   Authorization Type [employer]    Authorization Time Period 10/20/2015 to 12/01/2015    PT Start Time 1610   PT Stop Time 1648   PT Time Calculation (min) 38 min   Activity Tolerance Patient tolerated treatment well   Behavior During Therapy [facility] for tasks assessed/performed      Past Medical History  Diagnosis Date  . Asthma     Past Surgical History  Procedure Laterality Date  . Cesarean section    . Arm surgery    . Fracture surgery      There were no vitals filed for this visit.       Subjective Assessment - 10/20/15 1617    Subjective Patty White is a 50 yo female who reports a long hx of low back and upper back pain associated with scoliosis, which she has had since she was a child. 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 She reports that she has to stand for long periods of time in order to complete work duties, which aggravates her low and mid back pain. Her goal is to reduce her pain levels in order to complete work duties and recreational activities with less difficulty. She has not received any prior PT for her current complaints.    Pertinent History R forearm fx and scoliosis    Limitations Standing;Walking;Lifting;Other (comment)  Lifting >15#   How long can you sit comfortably? 30-45 minutes    How long can you stand comfortably? 3 hours    How long can you walk comfortably? 30 minutes    Patient Stated Goals Patient's goal is to  reduce her pain and be able to tolerate work related activities.    Currently in Pain? Yes   Pain Score 3   Upper and low back pain ranges between a 0-8/10 on a VAS   Pain Location Back   Pain Orientation Lower;Upper;Right;Left;Posterior   Pain Descriptors / Indicators Aching;Burning   Pain Type Chronic pain   Pain Radiating Towards None    Pain Onset More than a month ago   Pain Frequency Constant   Aggravating Factors  standing, walking, and lifting activities    Pain Relieving Factors Pain meds, lying supine, and cold packs    Effect of Pain on Daily Activities Limited with prolonged standing at work and long distance hiking/walking             [facility] PT [facility] - 10/20/15 0001    Assessment   Medical Diagnosis Low back pain secondary to Scoliosis    Referring Provider Dr. Minda Meouben Torrealba Rodriguez   Onset Date/Surgical Date 05/11/15   Hand Dominance Right   Next MD Visit May 2017   Prior Therapy No prior PT for current complaints    Precautions   Precautions None   Restrictions   Weight Bearing Restrictions No   Balance Screen   Has the patient fallen in the past 6 months No   Has the  patient had a decrease in activity level because of a fear of falling?  No   Is the patient reluctant to leave their home because of a fear of falling?  No   Cognition   Overall Cognitive Status Within Functional Limits for tasks assessed   Observation/Other Assessments   Observations Muscular atrophy of B LE assessed, specifically of gluteal musculature    Focus on Therapeutic Outcomes (FOTO)  51% limited    Sensation   Light Touch Appears Intact   Posture/Postural Control   Posture/Postural Control Postural limitations   Postural Limitations Forward head;Rounded Shoulders;Increased thoracic kyphosis  R sided thoracic scoliosis; compensatory L lumbar scoliosis    ROM / Strength   AROM / PROM / Strength Strength;AROM   AROM   Overall AROM Comments B LE AROM is WNL with muscular  tightness assess of B LE    AROM Assessment Site Lumbar;Thoracic   Lumbar Flexion WFL  pain-free   Lumbar Extension 16 degrees  pain-free   Lumbar - Right Side Bend 22 degrees  pain-free   Lumbar - Left Side Bend 27 degrees   pain-free   Lumbar - Right Rotation WNL  pain-free   Lumbar - Left Rotation 25% limited   pain-free   Strength   Strength Assessment Site Hip;Knee;Ankle;Lumbar   Right/Left Hip Right;Left   Right Hip Flexion 4/5   Right Hip Extension 4-/5   Right Hip ABduction 4/5   Left Hip Flexion 4/5   Left Hip Extension 4-/5   Left Hip ABduction 4/5   Right/Left Knee Right;Left   Right Knee Flexion 4/5   Right Knee Extension 5/5   Left Knee Flexion 4/5   Left Knee Extension 5/5   Right/Left Ankle Left;Right   Right Ankle Dorsiflexion 5/5   Right Ankle Plantar Flexion 5/5   Right Ankle Inversion 5/5   Right Ankle Eversion 5/5   Left Ankle Dorsiflexion 5/5   Left Ankle Plantar Flexion 5/5   Left Ankle Inversion 5/5   Left Ankle Eversion 5/5   Lumbar Flexion 3+/5   Lumbar Extension 3+/5   Flexibility   Soft Tissue Assessment /Muscle Length yes   Hamstrings WNL   Quadriceps 25% limited    ITB 25% limited    Piriformis 25% limited    Quadratus Lumborum Limited, R worse than L due to scoliosis    Palpation   Palpation comment Tenderness to palpation assessed of Upper/Middle/lower traps and qudratus lumborum    Special Tests    Special Tests Leg LengthTest;Lumbar   Lumbar Tests other   Leg length test  --  Pelvic alignment was WNL in supine   other   Findings Positive   Comments Adam's forward bend test                            PT Education - 10/20/15 1843    Education provided Yes   Education Details PT eval findings, use of cryotherapy and moist heat therapy for pain management, and initial HEP   Person(s) Educated Patient   Methods Explanation;Demonstration;Handout   Comprehension Verbalized understanding;Returned demonstration           PT Short Term Goals - 10/20/15 1858    PT SHORT TERM GOAL #1   Title Patient will independently verbalize and demo full understanding with initial HEP.   Time 2   Period Weeks   Status New   PT SHORT TERM GOAL #2   Title Patient  will demo improved postural awareness with min to no cues required in order to reduce stress on the spine thus reducing pain levels.   Time 3   Period Weeks   Status New   PT SHORT TERM GOAL #3   Title Patient will report decreased max thoracolumbar pain to 5/10 on a VAS in order to improve tolerance with work related activities.   Time 3   Period Weeks   Status New           PT Long Term Goals - 10/20/15 1901    PT LONG TERM GOAL #1   Title Patient will independently verbalize and demo full understanding with advanced HEP in order to continue with strengthening and stretchign ther ex once DC from PT.    Time 6   Period Weeks   Status New   PT LONG TERM GOAL #2   Title Patient will report decreased thoracolumbar pain to 0-4/10 on a VAS in order to improve tolerance with long distance ambulation/hiking to > 1 hour.    Time 6   Period Weeks   Status New   PT LONG TERM GOAL #3   Title Patient will demo improved B hip and core strength by 1 MMT grade in order to improve thoracolumbar stability with lifting activities >20# with no pain.    Time 6   Period Weeks   Status New   PT LONG TERM GOAL #4   Title Patient will improve FOTO limitation to <30% in order to progress towards her PLOF with recreational activities.    Time 6   Period Weeks   Status New   PT LONG TERM GOAL #5   Title Patient will demo improved thoracolumbar AROM to WNL in order to improve trunk mobility with work related activities.   Time 6   Period Weeks   Status New               Plan - 10/20/15 1845    Clinical Impression Statement Patty White is a 50 yo female who was referred to outpatient PT secondary to lumbar and upper thoracic spine pain  associated with a long hx of scoliosis. The patient presents with signs and symptoms that are consistent with MD referral of back pain associated with scoliosis. The patient presents with dextroscoliosis at the thoracolumbar junction with levoscoliosis in the lumbar spine that was assessed with the Adam's forward bend test. Upon further examination, the patient presents with core/postural weakness, B hip weakness, kyphotic/scoliotic posture, thoracolumbar pain, impaired flexibility of the quadratus lumborum/pec min and major/piriformis muscles and decreased thoracolumbar AROM into L rotation. The patient would benefit from skilled PT for 2x/week for 6 weeks in order to improve thoracolumbar mobility, flexibility, and strength with recreational and work related activities. The patient is in agreement with proposed PT POC and frequency.    Rehab Potential Good   Clinical Impairments Affecting Rehab Potential Chronic nature of scoliosis and back pain    PT Frequency 2x / week   PT Duration 6 weeks   PT Treatment/Interventions ADLs/Self Care Home Management;Cryotherapy;Electrical Stimulation;Moist Heat;Ultrasound;Therapeutic activities;Therapeutic exercise;Balance training;Neuromuscular re-education;Patient/family education;Taping;Passive range of motion;Manual techniques   PT Next Visit Plan Next visit to focus on quadratus lumborum stretches, R side-bending stretches of the T-spine,hip strengthening, and postural strengthening and re-education.    PT Home Exercise Plan HEP initiated this visit with addition of scap squeezes and Quadratus Lumborum stretch/Scoliosis Stretch   Recommended Other Services None at this time    Consulted  and Agree with Plan of Care Patient      Patient will benefit from skilled therapeutic intervention in order to improve the following deficits and impairments:  Decreased strength, Improper body mechanics, Postural dysfunction, Decreased mobility, Impaired flexibility, Decreased  range of motion, Pain, Increased fascial restricitons  Visit Diagnosis: Bilateral low back pain without sciatica - Plan: PT plan of care cert/re-cert  Pain in thoracic spine - Plan: PT plan of care cert/re-cert  Muscle weakness (generalized) - Plan: PT plan of care cert/re-cert  Abnormal posture - Plan: PT plan of care cert/re-cert     Problem List There are no active problems to display for this patient.   Bonnee Quin, PT, DPT    10/20/2015, 7:08 PM  [facility] [address] [place], [place], [address] Phone: [phone]   Fax:  [phone]  Name: Patty White MRN: [id number] Date of Birth: Sep 13, 1965

## 2015-10-28 ENCOUNTER — Ambulatory Visit (HOSPITAL_COMMUNITY): Payer: 59 | Admitting: Physical Therapy

## 2015-10-28 DIAGNOSIS — M545 Low back pain, unspecified: Secondary | ICD-10-CM

## 2015-10-28 DIAGNOSIS — M6281 Muscle weakness (generalized): Secondary | ICD-10-CM

## 2015-10-28 DIAGNOSIS — M546 Pain in thoracic spine: Secondary | ICD-10-CM

## 2015-10-28 DIAGNOSIS — R293 Abnormal posture: Secondary | ICD-10-CM

## 2015-10-28 NOTE — Therapy (Signed)
[place] [facility] [address] [place], [place], [address] Phone: [phone]   Fax:  [phone]  Physical Therapy Treatment  Patient Details  Name: Patty White MRN: [id number] Date of Birth: 11-Sep-1965 Referring Provider: Dr. Malachy Chamber  Encounter Date: 10/28/2015      PT End of Session - 10/28/15 1752    Visit Number 2   Number of Visits 13   Date for PT Re-Evaluation 11/19/15   Authorization Type [employer]    Authorization Time Period 10/20/2015 to 12/01/2015    PT Start Time 1736   PT Stop Time 1810   PT Time Calculation (min) 34 min   Activity Tolerance Patient tolerated treatment well   Behavior During Therapy [facility] for tasks assessed/performed      Past Medical History  Diagnosis Date  . Asthma     Past Surgical History  Procedure Laterality Date  . Cesarean section    . Arm surgery    . Fracture surgery      There were no vitals filed for this visit.      Subjective Assessment - 10/28/15 1737    Subjective Pt stated she has increased pain following work, has been on feet all day with increased lower back.  Reports compliance with HEP without questions.   Pertinent History R forearm fx and scoliosis    Patient Stated Goals Patient's goal is to reduce her pain and be able to tolerate work related activities.    Currently in Pain? Yes   Pain Score 9    Pain Location Back   Pain Orientation Lower;Upper   Pain Descriptors / Indicators Aching   Pain Type Chronic pain   Pain Radiating Towards None   Pain Onset More than a month ago   Pain Frequency Constant   Aggravating Factors  standing, walking, and lifting activities   Pain Relieving Factors Pain meds, lying supine, and cold packs    Effect of Pain on Daily Activities Limited with prolonged standing at work and long distance hiking/walking             [facility] Adult PT Treatment/Exercise - 10/28/15 0001    Exercises   Exercises Lumbar;Neck   Neck Exercises: Seated   Neck Retraction 10 reps   Shoulder Rolls Backwards;10 reps   Other Seated Exercise scapular retraction 10x5   Lumbar Exercises: Stretches   Lobbyist 3 reps;30 seconds;Limitations   Lobbyist Limitations QL stretch to Rt    Lumbar Exercises: Seated   Other Seated Lumbar Exercises cervcial and scapular retraction 10x 5"   Other Seated Lumbar Exercises Educated on importance of posture with verbal, visual and tactile cueing   Lumbar Exercises: Supine   Ab Set 10 reps;5 seconds   AB Set Limitations verbal and tactile cueing; posterior pelvic tilt   Bent Knee Raise 10 reps;5 seconds   Bent Knee Raise Limitations cueing for stabiltiy   Lumbar Exercises: Sidelying   Other Sidelying Lumbar Exercises Rt sidelying QL stretch with 2 pillows under             PT Short Term Goals - 10/20/15 1858    PT SHORT TERM GOAL #1   Title Patient will independently verbalize and demo full understanding with initial HEP.   Time 2   Period Weeks   Status New   PT SHORT TERM GOAL #2   Title Patient will demo improved postural awareness with min to no cues required in order to reduce stress on  the spine thus reducing pain levels.   Time 3   Period Weeks   Status New   PT SHORT TERM GOAL #3   Title Patient will report decreased max thoracolumbar pain to 5/10 on a VAS in order to improve tolerance with work related activities.   Time 3   Period Weeks   Status New           PT Long Term Goals - 10/20/15 1901    PT LONG TERM GOAL #1   Title Patient will independently verbalize and demo full understanding with advanced HEP in order to continue with strengthening and stretchign ther ex once DC from PT.    Time 6   Period Weeks   Status New   PT LONG TERM GOAL #2   Title Patient will report decreased thoracolumbar pain to 0-4/10 on a VAS in order to improve tolerance with long distance ambulation/hiking to > 1 hour.    Time 6   Period Weeks   Status New   PT  LONG TERM GOAL #3   Title Patient will demo improved B hip and core strength by 1 MMT grade in order to improve thoracolumbar stability with lifting activities >20# with no pain.    Time 6   Period Weeks   Status New   PT LONG TERM GOAL #4   Title Patient will improve FOTO limitation to <30% in order to progress towards her PLOF with recreational activities.    Time 6   Period Weeks   Status New   PT LONG TERM GOAL #5   Title Patient will demo improved thoracolumbar AROM to WNL in order to improve trunk mobility with work related activities.   Time 6   Period Weeks   Status New               Plan - 10/28/15 1817    Clinical Impression Statement Reviewed goals, compliance and assured correct technique and encouraged increased frequency with HEP and copy given to pt.  Pt educated on importance of posture with verbal, visual and tactile cueing to reduce stress on spinal musculature.  Session focus on improving postural awareness and strengthening as well as core strengtheing.  Therapist facilitation for proper form and control with all exercises with multimodal cueing.  End of session pt encouraged to increase frequency with HEP and improve awareness of posture.  Pt reports decreased pain.   Rehab Potential Good   Clinical Impairments Affecting Rehab Potential Chronic nature of scoliosis and back pain    PT Frequency 2x / week   PT Duration 6 weeks   PT Treatment/Interventions ADLs/Self Care Home Management;Cryotherapy;Electrical Stimulation;Moist Heat;Ultrasound;Therapeutic activities;Therapeutic exercise;Balance training;Neuromuscular re-education;Patient/family education;Taping;Passive range of motion;Manual techniques   PT Next Visit Plan Next session continue to focus on postural awareness and strenghtening, core strengthening and proximal musculature strengtheing, QL and R side bending strengthening.      Patient will benefit from skilled therapeutic intervention in order to  improve the following deficits and impairments:  Decreased strength, Improper body mechanics, Postural dysfunction, Decreased mobility, Impaired flexibility, Decreased range of motion, Pain, Increased fascial restricitons  Visit Diagnosis: Bilateral low back pain without sciatica  Pain in thoracic spine  Muscle weakness (generalized)  Abnormal posture     Problem List There are no active problems to display for this patient.  842 Theatre StreetCasey Cockerham, LPTA; CBIS [phone]  Juel BurrowCockerham, Casey Jo 10/28/2015, 6:23 PM  [facility] [address] [place], [place],  [address] Phone: [phone]   Fax:  [phone]  Name: Patty White MRN: [id number] Date of Birth: 09/19/1965

## 2015-11-03 ENCOUNTER — Ambulatory Visit (HOSPITAL_COMMUNITY): Payer: 59

## 2015-11-05 ENCOUNTER — Encounter (HOSPITAL_COMMUNITY): Payer: 59 | Admitting: Physical Therapy

## 2015-11-08 ENCOUNTER — Ambulatory Visit (HOSPITAL_COMMUNITY): Payer: 59 | Admitting: Physical Therapy

## 2015-11-10 ENCOUNTER — Encounter (HOSPITAL_COMMUNITY): Payer: 59

## 2015-11-15 ENCOUNTER — Ambulatory Visit (HOSPITAL_COMMUNITY): Payer: 59 | Admitting: Physical Therapy

## 2015-11-15 ENCOUNTER — Telehealth (HOSPITAL_COMMUNITY): Payer: Self-pay

## 2015-11-15 NOTE — Telephone Encounter (Signed)
11/15/15 patient said that the times we scheduled her for just do not work with her work schedule.... She did reschedule for one day this week while she was off

## 2015-11-17 ENCOUNTER — Encounter (HOSPITAL_COMMUNITY): Payer: 59

## 2015-11-18 ENCOUNTER — Ambulatory Visit (HOSPITAL_COMMUNITY): Payer: 59 | Attending: Orthopedic Surgery | Admitting: Physical Therapy

## 2015-11-18 DIAGNOSIS — R293 Abnormal posture: Secondary | ICD-10-CM | POA: Insufficient documentation

## 2015-11-18 DIAGNOSIS — M546 Pain in thoracic spine: Secondary | ICD-10-CM | POA: Diagnosis present

## 2015-11-18 DIAGNOSIS — M545 Low back pain, unspecified: Secondary | ICD-10-CM

## 2015-11-18 DIAGNOSIS — M6281 Muscle weakness (generalized): Secondary | ICD-10-CM | POA: Diagnosis present

## 2015-11-18 NOTE — Therapy (Signed)
[place] [facility] [address] [place], [place], [address] Phone: [phone]   Fax:  [phone]  Physical Therapy Treatment  Patient Details  Name: Patty FrancoKaren White MRN: [id number] Date of Birth: 02/20/1966 Referring Provider: Dr. Malachy Chamberuben Torrealba  Encounter Date: 11/18/2015      PT End of Session - 11/18/15 1351    Visit Number 3   Number of Visits 13   Date for PT Re-Evaluation 11/19/15   Authorization Type [employer]    Authorization Time Period 10/20/2015 to 12/01/2015    PT Start Time 1305   PT Stop Time 1348   PT Time Calculation (min) 43 min   Activity Tolerance Patient tolerated treatment well   Behavior [facility] assessed/performed      Past Medical History  Diagnosis Date  . Asthma     Past Surgical History  Procedure Laterality Date  . Cesarean section    . Arm surgery    . Fracture surgery      There were no vitals filed for this visit.      Subjective Assessment - 11/18/15 1527    Subjective PT states she has missed alot of appointments due to her work schedule.  Reports she has been completing what was given to her at her last session   currently without pain.    Currently in Pain? No/denies                         [facility] Adult [facility] Treatment/Exercise - 11/18/15 0001    Lumbar Exercises: Standing   Functional Squats 10 reps   Functional Squats Limitations cues for form   Scapular Retraction Both;10 reps;Theraband   Theraband Level (Scapular Retraction) Level 2 (Red)   Row Both;10 reps;Theraband   Theraband Level (Row) Level 2 (Red)   Shoulder Extension Both;10 reps;Theraband   Theraband Level (Shoulder Extension) Level 2 (Red)   Lumbar Exercises: Prone   Single Arm Raise 10 reps   Straight Leg Raise 10 reps   Opposite Arm/Leg Raise 10 reps                PT Education - 11/18/15 1522    Education provided Yes   Education Details updated HEP with theraband  exercises and educated on postural strengthening and importance to scoliolis and aging   Person(s) Educated Patient   Methods Explanation;Handout;Demonstration   Comprehension Verbalized understanding;Returned demonstration;Need further instruction          PT Short Term Goals - 10/20/15 1858    PT SHORT TERM GOAL #1   Title Patient will independently verbalize and demo full understanding with initial HEP.   Time 2   Period Weeks   Status New   PT SHORT TERM GOAL #2   Title Patient will demo improved postural awareness with min to no cues required in order to reduce stress on the spine thus reducing pain levels.   Time 3   Period Weeks   Status New   PT SHORT TERM GOAL #3   Title Patient will report decreased max thoracolumbar pain to 5/10 on a VAS in order to improve tolerance with work related activities.   Time 3   Period Weeks   Status New           PT Long Term Goals - 10/20/15 1901    PT LONG TERM GOAL #1   Title Patient will independently verbalize and demo full understanding with advanced  HEP in order to continue with strengthening and stretchign ther ex once DC from PT.    Time 6   Period Weeks   Status New   PT LONG TERM GOAL #2   Title Patient will report decreased thoracolumbar pain to 0-4/10 on a VAS in order to improve tolerance with long distance ambulation/hiking to > 1 hour.    Time 6   Period Weeks   Status New   PT LONG TERM GOAL #3   Title Patient will demo improved B hip and core strength by 1 MMT grade in order to improve thoracolumbar stability with lifting activities >20# with no pain.    Time 6   Period Weeks   Status New   PT LONG TERM GOAL #4   Title Patient will improve FOTO limitation to <30% in order to progress towards her PLOF with recreational activities.    Time 6   Period Weeks   Status New   PT LONG TERM GOAL #5   Title Patient will demo improved thoracolumbar AROM to WNL in order to improve trunk mobility with work related  activities.   Time 6   Period Weeks   Status New               Plan - 11/18/15 1351    Clinical Impression Statement Progressed postural strengthening this session adding prone exercises and standing theraband exericses.  Pt able to complete with therapist facilitation and noted weakness.  Pt educated on purpose of therex and encoruaged to complete these as instructed at home for optimal beneifts.  Pt has had issues geting to therapy due to her work schedule.  Pt with noted fatigue at end of session . No complaints of pain or dyscomfort at end of session.     Rehab Potential Good   Clinical Impairments Affecting Rehab Potential Chronic nature of scoliosis and back pain    PT Frequency 2x / week   PT Duration 6 weeks   PT Treatment/Interventions ADLs/Self Care Home Management;Cryotherapy;Electrical Stimulation;Moist Heat;Ultrasound;Therapeutic activities;Therapeutic exercise;Balance training;Neuromuscular re-education;Patient/family education;Taping;Passive range of motion;Manual techniques   PT Next Visit Plan Continue to focus on postural and core strengthening.  Add lunges and SLS vectors next session.    PT Home Exercise Plan updated including postural 3 with theraband, standing hip ex and squats.      Patient will benefit from skilled therapeutic intervention in order to improve the following deficits and impairments:  Decreased strength, Improper body mechanics, Postural dysfunction, Decreased mobility, Impaired flexibility, Decreased range of motion, Pain, Increased fascial restricitons  Visit Diagnosis: Bilateral low back pain without sciatica  Pain in thoracic spine  Muscle weakness (generalized)  Abnormal posture     Problem List There are no active problems to display for this patient.   Lurena Nida, PTA/CLT [phone]  11/18/2015, 3:28 PM  [place] [facility] [address] [place], [place], [address] Phone:  [phone]   Fax:  [phone]  Name: Patty White MRN: [id number] Date of Birth: 17-Feb-1966

## 2015-11-22 ENCOUNTER — Ambulatory Visit (HOSPITAL_COMMUNITY): Payer: 59 | Admitting: Physical Therapy

## 2015-11-23 ENCOUNTER — Ambulatory Visit (HOSPITAL_COMMUNITY): Payer: 59 | Admitting: Physical Therapy

## 2015-11-23 DIAGNOSIS — M545 Low back pain, unspecified: Secondary | ICD-10-CM

## 2015-11-23 DIAGNOSIS — M6281 Muscle weakness (generalized): Secondary | ICD-10-CM

## 2015-11-23 DIAGNOSIS — R293 Abnormal posture: Secondary | ICD-10-CM

## 2015-11-23 DIAGNOSIS — M546 Pain in thoracic spine: Secondary | ICD-10-CM

## 2015-11-23 NOTE — Therapy (Signed)
[place], [place], [address] Phone: [phone]   Fax:  [phone]  Physical Therapy Treatment/ reassessement   Patient Details  Name: Patty White MRN: [id number] Date of Birth: 06-02-1966 Referring Provider: Dr. Ivan Croft  Encounter Date: 11/23/2015      PT End of Session - 11/23/15 1533    Visit Number 4   Number of Visits 13   Date for PT Re-Evaluation 12/23/2015   Authorization Type [place] Time Period 10/20/2015 to 12/01/2015    PT Start Time 1304   PT Stop Time 1348   PT Time Calculation (min) 44 min   Activity Tolerance Patient tolerated treatment well   Behavior During Therapy [facility] for tasks assessed/performed      Past Medical History  Diagnosis Date  . Asthma     Past Surgical History  Procedure Laterality Date  . Cesarean section    . Arm surgery    . Fracture surgery      There were no vitals filed for this visit.      Subjective Assessment - 11/23/15 1306    Subjective Pt states that her back hurts all the time all she really needs is pain pills for while she is at work.    Currently in Pain? Yes   Pain Score 3    Pain Location Back   Pain Orientation Lower   Pain Descriptors / Indicators Aching   Pain Type Chronic pain            [facility] PT Assessment - 11/23/15 0001    Assessment   Medical Diagnosis Low back pain secondary to Scoliosis    Onset Date/Surgical Date 05/11/15   Hand Dominance Right   Next MD Visit May 2017   Prior Therapy No prior PT for current complaints    Precautions   Precautions None   Restrictions   Weight Bearing Restrictions No   Cognition   Overall Cognitive Status Within Functional Limits for tasks assessed   Observation/Other Assessments   Observations Muscular atrophy of B LE assessed, specifically of gluteal musculature    Focus on Therapeutic Outcomes (FOTO)  51% limited    Sensation   Light Touch  Appears Intact   Posture/Postural Control   Posture/Postural Control Postural limitations   Postural Limitations Forward head;Rounded Shoulders;Increased thoracic kyphosis  R sided thoracic scoliosis; compensatory L lumbar scoliosis    AROM   Overall AROM Comments B LE AROM is WNL with muscular tightness assess of B LE    Lumbar Flexion WFL  pain-free   Lumbar Extension 30 degrees  was 16    Lumbar - Right Side Bend 22 degrees  pain-free was 22    Lumbar - Left Side Bend 27 degrees   pain-free was 27    Lumbar - Right Rotation WNL  pain-free   Lumbar - Left Rotation 15% limited   was 25% limited    Strength   Right Hip Flexion 5/5  was 4/5   Right Hip Extension 4+/5  was s4-/5    Right Hip ABduction 4/5   Left Hip Flexion 5/5  was 4/5    Left Hip Extension 4+/5  was 4-/5   Left Hip ABduction 4/5   Right Knee Flexion 5/5  was 4/5   Right Knee Extension 5/5   Left Knee Flexion 4/5  was 4/5    Left Knee Extension 5/5   Right Ankle Dorsiflexion 5/5  Right Ankle Plantar Flexion 5/5   Right Ankle Inversion 5/5   Right Ankle Eversion 5/5   Left Ankle Dorsiflexion 5/5   Left Ankle Plantar Flexion 5/5   Left Ankle Inversion 5/5   Left Ankle Eversion 5/5   Lumbar Flexion 3+/5   Lumbar Extension 3+/5   Flexibility   Soft Tissue Assessment /Muscle Length yes   Hamstrings WNL   Quadriceps 25% limited    ITB 25% limited    Piriformis 25% limited    Quadratus Lumborum Limited, R worse than L due to scoliosis    Palpation   Palpation comment Tenderness to palpation assessed of Upper/Middle/lower traps and qudratus lumborum    Special Tests    Special Tests Leg LengthTest;Lumbar   Lumbar Tests other   other   Findings Positive   Comments Adam's forward bend test                      [facility] Adult PT Treatment/Exercise - 11/23/15 0001    Lumbar Exercises: Stretches   Double Knee to Chest Stretch 30 seconds;3 reps   Lower Trunk Rotation 5 reps   Lumbar  Exercises: Sidelying   Other Sidelying Lumbar Exercises side plank B x 5" x 5    Lumbar Exercises: Prone   Single Arm Raise 10 reps   Straight Leg Raise 15 reps   Other Prone Lumbar Exercises I's , Y's and T's x 10    Other Prone Lumbar Exercises scapular retraction with , shoulder extension B with 2# x 10;    Lumbar Exercises: Quadruped   Madcat/Old Horse 5 reps   Opposite Arm/Leg Raise Right arm/Left leg;Left arm/Right leg;5 reps   Plank 10' x 5                 PT Education - 11/23/15 1334    Education Details updated HEP with quadriped exercises    Person(s) Educated Patient   Methods Explanation;Verbal cues;Demonstration   Comprehension Verbalized understanding;Returned demonstration          PT Short Term Goals - 11/23/15 1340    PT SHORT TERM GOAL #1   Title Patient will independently verbalize and demo full understanding with initial HEP.   Time 2   Period Weeks   Status Partially Met   PT SHORT TERM GOAL #2   Title Patient will demo improved postural awareness with min to no cues required in order to reduce stress on the spine thus reducing pain levels.   Time 3   Period Weeks   Status Achieved   PT SHORT TERM GOAL #3   Title Patient will report decreased max thoracolumbar pain to 5/10 on a VAS in order to improve tolerance with work related activities.   Baseline 5-16: has been as high as a 7/10   Time 3   Period Weeks   Status On-going           PT Long Term Goals - 11/23/15 1341    PT LONG TERM GOAL #1   Title Patient will independently verbalize and demo full understanding with advanced HEP in order to continue with strengthening and stretchign ther ex once DC from PT.    Period Weeks   Status Partially Met   PT LONG TERM GOAL #2   Title Patient will report decreased thoracolumbar pain to 0-4/10 on a VAS in order to improve tolerance with long distance ambulation/hiking to > 1 hour.    Time 6   Status  Partially Met   PT LONG TERM GOAL #3    Title Patient will demo improved B hip and core strength by 1 MMT grade in order to improve thoracolumbar stability with lifting activities >20# with no pain.    Time 6   Period Weeks   Status On-going   PT LONG TERM GOAL #4   Title Patient will improve FOTO limitation to <30% in order to progress towards her PLOF with recreational activities.    Time 6   Period Weeks   Status Unable to assess   PT LONG TERM GOAL #5   Title Patient will demo improved thoracolumbar AROM to WNL in order to improve trunk mobility with work related activities.   Time 6   Period Weeks   Status On-going               Plan - 11/23/15 1533    Clinical Impression Statement Ms. Shaheed was reassessed today with improvement noted in strength and slight improvement in ROM but not subjective improvement although the patient admits to not completing her HEP.  Pt was instructed in quadriped exercises.  Ms. Silos will benefit from skilled therapy for higher level core exercises to assist in decreasing her pain and improving her functional  tolerance.    Rehab Potential Good   Clinical Impairments Affecting Rehab Potential Chronic nature of scoliosis and back pain    PT Frequency 2x / week   PT Duration 6 weeks   PT Treatment/Interventions ADLs/Self Care Home Management;Cryotherapy;Electrical Stimulation;Moist Heat;Ultrasound;Therapeutic activities;Therapeutic exercise;Balance training;Neuromuscular re-education;Patient/family education;Taping;Passive range of motion;Manual techniques   PT Next Visit Plan Add lunges and SLS vector next visit as patient needed to be reassessed today.       Patient will benefit from skilled therapeutic intervention in order to improve the following deficits and impairments:  Decreased strength, Improper body mechanics, Postural dysfunction, Decreased mobility, Impaired flexibility, Decreased range of motion, Pain, Increased fascial restricitons  Visit Diagnosis: Bilateral  low back pain without sciatica  Pain in thoracic spine  Muscle weakness (generalized)  Abnormal posture     Problem List There are no active problems to display for this patient.   Rayetta Humphrey, PT CLT [phone] 11/23/2015, 3:39 PM  [place] [address] [place], [place], [address] Phone: [phone]   Fax:  [phone]  Name: Patty White MRN: [id number] Date of Birth: [birth year]

## 2015-11-23 NOTE — Patient Instructions (Signed)
Opposite Arm / Leg Lift (Prone)    Abdomen and head supported, left knee locked, raise leg and 2opposite arm _2___ inches from floor. Repeat __10__ times per set. Do _1___ sets per session. Do ____ sessions per day.  http://orth.exer.us/1114   Copyright  VHI. All rights reserved.  Straight Leg Raise (Prone)    Abdomen and head supported, keep left knee locked and raise leg at hip. Avoid arching low back. Repeat __10__ times per set. Do ___1_ sets per session. Do _2___ sessions per day.  http://orth.exer.us/1112   Copyright  VHI. All rights reserved.  Scapular Retraction (Prone)    Lie with arms at sides. Pinch shoulder blades together and raise arms a few inches from floor. Repeat __10__ times per set. Do ___1_ sets per session. Do __2__ sessions per day.  http://orth.exer.us/954   Copyright  VHI. All rights reserved.  Scapular Retraction (Prone)    Lie with arms above head. Pinch shoulder blades together. Repeat _10___ times per set. Do __1_ sets per session. Do _2__ sessions per day.  http://orth.exer.us/942   Copyright  VHI. All rights reserved.  Upper / Lower Extremity Extension (All-Fours)    Tighten stomach and raise right leg and opposite arm. Keep trunk rigid. Repeat _5-10__ times per set. Do ___1_ sets per session. Do __1__ sessions per day.  http://orth.exer.us/1118   Copyright  VHI. All rights reserved.  Lower Trunk Rotation Stretch    Keeping back flat and feet together, rotate knees to left side. Hold __3__ seconds. Repeat __10__ times per set. Do 1____ sets per session. Do ___2_ sessions per day.  http://orth.exer.us/122   Copyright  VHI. All rights reserved.

## 2015-11-24 ENCOUNTER — Encounter (HOSPITAL_COMMUNITY): Payer: 59

## 2015-11-26 ENCOUNTER — Ambulatory Visit (HOSPITAL_COMMUNITY): Payer: 59 | Admitting: Physical Therapy

## 2015-11-26 ENCOUNTER — Telehealth (HOSPITAL_COMMUNITY): Payer: Self-pay

## 2015-11-26 NOTE — Telephone Encounter (Signed)
11/26/15 pt came in at 4:00 thinking her appt was at that time.... It was 2:30.... She said she was told at her last appt as she was going out the door she was told "see you Friday at 4".

## 2015-11-29 ENCOUNTER — Encounter (HOSPITAL_COMMUNITY): Payer: 59 | Admitting: Physical Therapy

## 2015-11-30 ENCOUNTER — Ambulatory Visit (HOSPITAL_COMMUNITY): Payer: 59

## 2015-11-30 DIAGNOSIS — R293 Abnormal posture: Secondary | ICD-10-CM

## 2015-11-30 DIAGNOSIS — M545 Low back pain, unspecified: Secondary | ICD-10-CM

## 2015-11-30 DIAGNOSIS — M546 Pain in thoracic spine: Secondary | ICD-10-CM

## 2015-11-30 DIAGNOSIS — M6281 Muscle weakness (generalized): Secondary | ICD-10-CM

## 2015-11-30 NOTE — Therapy (Signed)
[facility] [address] [place], [place], [address] Phone: [phone]   Fax:  [phone]  Physical Therapy Treatment  Patient Details  Name: Patty White MRN: [id number] Date of Birth: 09/01/1965 Referring Provider: Dr. Ivan Croft  Encounter Date: 11/30/2015      PT End of Session - 11/30/15 2100    Visit Number 5   Number of Visits 13   Date for PT Re-Evaluation 12/23/15   Authorization Type [facility] Time Period 10/20/2015 to 12/01/2015    PT Start Time 1610   PT Stop Time 1645   PT Time Calculation (min) 35 min   Activity Tolerance Patient tolerated treatment well   Behavior During Therapy [facility] for tasks assessed/performed      Past Medical History  Diagnosis Date  . Asthma     Past Surgical History  Procedure Laterality Date  . Cesarean section    . Arm surgery    . Fracture surgery      There were no vitals filed for this visit.      Subjective Assessment - 11/30/15 1618    Subjective Pt states that her back was hurting really badly.  She states that she has not been doing well at doing her HEP, especially the ones where she has to lay down on the ground to do them.  pt states that her pain goes away after sleeping at night.    Currently in Pain? Yes   Pain Score 3    Pain Location Back   Pain Orientation Lower   Pain Descriptors / Indicators Aching   Pain Type Chronic pain   Pain Radiating Towards Pt states that it does radiate down her LE's when she does a 9.5 hour day.                          [facility] Adult PT Treatment/Exercise - 11/30/15 0001    Exercises   Exercises Lumbar;Neck   Lumbar Exercises: Stretches   Double Knee to Chest Stretch --  2 x 10 reps no hold   Lower Trunk Rotation 5 reps  x2 sets   Quad Stretch 3 reps;30 seconds  bilaterally   Lumbar Exercises: Standing   Forward Lunge 10 reps  x2 sets with cues for toes fwd   Knee/Hip Exercises:  Standing   Hip Abduction Stengthening;Both;2 sets;10 reps  UE support for balance   Hip Extension Stengthening;Both;2 sets;10 reps  UE support for balance                PT Education - 11/30/15 2055    Education provided Yes   Education Details updated HEP with fwd lunges, as well as hip abduction   Person(s) Educated Patient   Methods Explanation;Demonstration;Verbal cues;Tactile cues   Comprehension Verbalized understanding          PT Short Term Goals - 11/23/15 1340    PT SHORT TERM GOAL #1   Title Patient will independently verbalize and demo full understanding with initial HEP.   Time 2   Period Weeks   Status Partially Met   PT SHORT TERM GOAL #2   Title Patient will demo improved postural awareness with min to no cues required in order to reduce stress on the spine thus reducing pain levels.   Time 3   Period Weeks   Status Achieved   PT SHORT TERM GOAL #3   Title Patient will report  decreased max thoracolumbar pain to 5/10 on a VAS in order to improve tolerance with work related activities.   Baseline 5-16: has been as high as a 7/10   Time 3   Period Weeks   Status On-going           PT Long Term Goals - 11/23/15 1341    PT LONG TERM GOAL #1   Title Patient will independently verbalize and demo full understanding with advanced HEP in order to continue with strengthening and stretchign ther ex once DC from PT.    Period Weeks   Status Partially Met   PT LONG TERM GOAL #2   Title Patient will report decreased thoracolumbar pain to 0-4/10 on a VAS in order to improve tolerance with long distance ambulation/hiking to > 1 hour.    Time 6   Status Partially Met   PT LONG TERM GOAL #3   Title Patient will demo improved B hip and core strength by 1 MMT grade in order to improve thoracolumbar stability with lifting activities >20# with no pain.    Time 6   Period Weeks   Status On-going   PT LONG TERM GOAL #4   Title Patient will improve FOTO  limitation to <30% in order to progress towards her PLOF with recreational activities.    Time 6   Period Weeks   Status Unable to assess   PT LONG TERM GOAL #5   Title Patient will demo improved thoracolumbar AROM to WNL in order to improve trunk mobility with work related activities.   Time 6   Period Weeks   Status On-going               Plan - 11/30/15 2101    Clinical Impression Statement Ms Harewood presented today, again stating that she has not done well with completing her HEP.  She states that she does the ones where she is sitting or standing, but not those that are down on the ground.  Discussed pt getting a yoga mat to complete the exercises with more ease at home.  Pt was instructed in fwd lunges today with need for supervision with balance, as well as correction with B foot placement.  Pt also progressed with hip extension and abduction exercises.  Pt will benefit from continued skilled PT, to reinforce core exercises, and to decrease pain.  Will need to continue to encourage pt to work on her chronic pain issues by completeing HEP at home as well as in the clinic.    Rehab Potential Good   Clinical Impairments Affecting Rehab Potential Chronic nature of scoliosis and back pain    PT Frequency 2x / week   PT Duration 6 weeks   PT Treatment/Interventions ADLs/Self Care Home Management;Cryotherapy;Electrical Stimulation;Moist Heat;Ultrasound;Therapeutic activities;Therapeutic exercise;Balance training;Neuromuscular re-education;Patient/family education;Taping;Passive range of motion;Manual techniques   PT Next Visit Plan Continue to with progression of hip extension and glut strengthening   PT Home Exercise Plan lunges and standing hip abduction    Consulted and Agree with Plan of Care Patient      Patient will benefit from skilled therapeutic intervention in order to improve the following deficits and impairments:     Visit Diagnosis: Bilateral low back pain without  sciatica  Pain in thoracic spine  Muscle weakness (generalized)  Abnormal posture     Problem List There are no active problems to display for this patient.   Beth Brogan England, PT, DPT X: [phone]   11/30/2015, 9:13 PM  [place], [place], [address] Phone: [phone]   Fax:  [phone]  Name: Keosha Rossa MRN: [id number] Date of Birth: 05-28-1966

## 2015-11-30 NOTE — Patient Instructions (Signed)
   LUNGE Start by standing with feet shoulder-width-apart. Next, take a step forward and allow your front knee to bend. Your back knee may bend as well. Then, return to original position, or you may walk and take a step forward and repeat with the other leg. Keep your pelvis level and straight the entire time. Your front knee should bend in line with the 2nd toe and not pass the front of the foot.  ELASTIC BAND HIP ABDUCTION While standing with an elastic band attached to your leg, pull an elastic band out to the side.  YOUR HOME PROGRAM  Repeat Hold Complete Perform Total 2 10 Times 1 Second 2 Sets 1 Time(s) a Day   Repeat Hold Complete Perform 10 Times 1 Second 2 Sets 2 Time(s) a Day

## 2015-12-01 ENCOUNTER — Encounter (HOSPITAL_COMMUNITY): Payer: 59

## 2015-12-07 ENCOUNTER — Ambulatory Visit (HOSPITAL_COMMUNITY): Payer: 59 | Admitting: Physical Therapy

## 2015-12-07 DIAGNOSIS — M545 Low back pain, unspecified: Secondary | ICD-10-CM

## 2015-12-07 DIAGNOSIS — M6281 Muscle weakness (generalized): Secondary | ICD-10-CM

## 2015-12-07 DIAGNOSIS — M546 Pain in thoracic spine: Secondary | ICD-10-CM

## 2015-12-07 DIAGNOSIS — R293 Abnormal posture: Secondary | ICD-10-CM

## 2015-12-07 NOTE — Therapy (Addendum)
[place], [place], [address] Phone: [phone]   Fax:  [phone]  Physical Therapy Treatment/Discharge  Patient Details  Name: Patty White MRN: [id number] Date of Birth: [birth year] Referring Provider: Ivan Croft, MD  Encounter Date: 12/07/2015      PT End of Session - 12/07/15 1822    Visit Number 6   Number of Visits 13   Date for PT Re-Evaluation 12/23/15   Authorization Type [place] Time Period 10/20/2015 to 12/01/2015    PT Start Time 1731   PT Stop Time 1809   PT Time Calculation (min) 38 min   Activity Tolerance Patient tolerated treatment well   Behavior During Therapy [facility] for tasks assessed/performed      Past Medical History  Diagnosis Date  . Asthma     Past Surgical History  Procedure Laterality Date  . Cesarean section    . Arm surgery    . Fracture surgery      There were no vitals filed for this visit.      Subjective Assessment - 12/07/15 1733    Subjective Pt states her back hasn't improved much and she would like to cancel the rest of her appointments so that she can go back to the spine specialist to possibly have surgery to correct her curves. She has been doing her exercises some but feels they are making her worse.    Pertinent History R forearm fx and scoliosis    Limitations Standing;Walking;Lifting;Other (comment)   How long can you sit comfortably? unsure   How long can you stand comfortably? has to stand at work for about 4 hours at a time   How long can you walk comfortably? 1 hour   Patient Stated Goals Patient's goal is to reduce her pain and be able to tolerate work related activities.    Currently in Pain? No/denies            [facility] PT Assessment - 12/07/15 0001    Assessment   Medical Diagnosis Low back pain secondary to Scoliosis    Referring Provider Ivan Croft, MD   Onset Date/Surgical Date 05/11/15   Hand  Dominance Right   Next MD Visit unsure   Prior Therapy No prior PT for current complaints    Precautions   Precautions None   Restrictions   Weight Bearing Restrictions No   Balance Screen   Has the patient fallen in the past 6 months No   Has the patient had a decrease in activity level because of a fear of falling?  No   Is the patient reluctant to leave their home because of a fear of falling?  No   Cognition   Overall Cognitive Status Within Functional Limits for tasks assessed   Observation/Other Assessments   Observations Muscular atrophy of B LE assessed, specifically of gluteal musculature    Focus on Therapeutic Outcomes (FOTO)  46% limited    Sensation   Light Touch Appears Intact   Posture/Postural Control   Posture/Postural Control Postural limitations   Postural Limitations Forward head;Rounded Shoulders;Increased thoracic kyphosis  R sided thoracic scoliosis; compensatory L lumbar scoliosis    AROM   Overall AROM Comments --   Lumbar Flexion --   Lumbar Extension --   Lumbar - Right Side Bend --   Lumbar - Left Side Bend --   Lumbar - Right Rotation --   Lumbar - Left Rotation --  Strength   Right Hip Flexion 5/5  was 4/5   Right Hip Extension 4+/5  was s4-/5    Right Hip ABduction 4/5   Left Hip Flexion 5/5  was 4/5    Left Hip Extension 5/5  was 4-/5   Left Hip ABduction 4/5   Right Knee Flexion 5/5  was 4/5   Right Knee Extension 5/5   Left Knee Flexion 4+/5  was 4/5    Left Knee Extension 5/5   Right Ankle Dorsiflexion 5/5   Right Ankle Plantar Flexion 5/5   Right Ankle Inversion 5/5   Right Ankle Eversion 5/5   Left Ankle Dorsiflexion 5/5   Left Ankle Plantar Flexion 5/5   Left Ankle Inversion 5/5   Left Ankle Eversion 5/5   Lumbar Flexion 3+/5   Lumbar Extension 3+/5   Flexibility   Soft Tissue Assessment /Muscle Length --   Hamstrings --   Quadriceps --   ITB --   Piriformis --   Quadratus Lumborum --   Palpation   Palpation  comment --   Special Tests    Special Tests --   Lumbar Tests --   other   Findings --   Comments --                     [facility] Adult PT Treatment/Exercise - 12/07/15 0001    Neck Exercises: Seated   Other Seated Exercise rows 2x15 with blue TB   Lumbar Exercises: Supine   Ab Set 10 reps;5 seconds  x2 sets   AB Set Limitations verbal/tactile cues   Bent Knee Raise 10 reps;5 seconds   Other Supine Lumbar Exercises elevated bent knee raises x10 reps                PT Education - 12/07/15 1821    Education provided Yes   Education Details finalized HEP; encouraged continued HEP adherence if she decides to pursue surgery and the importance of deep abdominal strength regardless of whether she gets surgery or not.   Person(s) Educated Patient   Methods Demonstration;Handout   Comprehension Verbalized understanding;Returned demonstration          PT Short Term Goals - 12/07/15 1833    PT SHORT TERM GOAL #1   Title Patient will independently verbalize and demo full understanding with initial HEP.   Baseline performing some of the exercises but not all of them   Time 2   Period Weeks   Status Partially Met   PT SHORT TERM GOAL #2   Title Patient will demo improved postural awareness with min to no cues required in order to reduce stress on the spine thus reducing pain levels.   Time 3   Period Weeks   Status Achieved   PT SHORT TERM GOAL #3   Title Patient will report decreased max thoracolumbar pain to 5/10 on a VAS in order to improve tolerance with work related activities.   Baseline 5-16: has been as high as a 7/10   Time 3   Period Weeks   Status Not Met           PT Long Term Goals - 12/07/15 1833    PT LONG TERM GOAL #1   Title Patient will independently verbalize and demo full understanding with advanced HEP in order to continue with strengthening and stretchign ther ex once DC from PT.    Period Weeks   Status Partially Met   PT LONG  TERM  GOAL #2   Title Patient will report decreased thoracolumbar pain to 0-4/10 on a VAS in order to improve tolerance with long distance ambulation/hiking to > 1 hour.    Time 6   Status Partially Met   PT LONG TERM GOAL #3   Title Patient will demo improved B hip and core strength by 1 MMT grade in order to improve thoracolumbar stability with lifting activities >20# with no pain.    Time 6   Period Weeks   Status Achieved   PT LONG TERM GOAL #4   Title Patient will improve FOTO limitation to <30% in order to progress towards her PLOF with recreational activities.    Baseline 46%    Time 6   Period Weeks   Status Not Met   PT LONG TERM GOAL #5   Title Patient will demo improved thoracolumbar AROM to WNL in order to improve trunk mobility with work related activities.   Time 6   Period Weeks   Status Not Met               Plan - 12/07/15 1823    Clinical Impression Statement Pt arrived today requesting to be discharged due to no noted improvement. She is planning to go back to her referring physician to see if she can have surgery to correct her curves. Therapist discussed the importance of HEP adherence and continued focus on deep abdominal strength/endurance regardless of whether or not she were to pursue surgery and provided pt with an advanced home management program to address this. Overall, she demonstrates improved BLE strength since her initial evaluation. She continues to demonstrate limitations throughout BLE flexibility, lumbar ROM and limited postural control which is largely due to her structural scoliosis. Therapist reviewed therex and encouraged continued walking program with her husband to improve fitness with pt returning and verbalizing understanding. Pt to be discharged today having met some of her goals.    Rehab Potential Good   Clinical Impairments Affecting Rehab Potential Chronic nature of scoliosis and back pain    PT Frequency 2x / week   PT Duration 6  weeks   PT Treatment/Interventions ADLs/Self Care Home Management;Cryotherapy;Electrical Stimulation;Moist Heat;Ultrasound;Therapeutic activities;Therapeutic exercise;Balance training;Neuromuscular re-education;Patient/family education;Taping;Passive range of motion;Manual techniques   PT Next Visit Plan updated HEP   PT Home Exercise Plan final HEP: ab set, ab set with alt bent knee raise, ab set with bent knee feet elevation and raise, seated rows with blue TB   Consulted and Agree with Plan of Care Patient      Patient will benefit from skilled therapeutic intervention in order to improve the following deficits and impairments:  Decreased strength, Improper body mechanics, Postural dysfunction, Decreased mobility, Impaired flexibility, Decreased range of motion, Pain, Increased fascial restricitons  Visit Diagnosis: Bilateral low back pain without sciatica  Pain in thoracic spine  Muscle weakness (generalized)  Abnormal posture     Problem List There are no active problems to display for this patient. PHYSICAL THERAPY DISCHARGE SUMMARY  Visits from Start of Care: 6  Current functional level related to goals / functional outcomes: Improved BLE strength, partial adherence to HEP, improved standing/walking tolerance   Remaining deficits: Pain, postural control, limited activity tolerance   Education / Equipment: Advanced home management program  Plan: Patient agrees to discharge.  Patient goals were partially met. Patient is being discharged due to the patient's request.  ?????       6:37 PM,12/07/2015 Elly Modena PT, DPT [facility] [place] [address] [place], [place], [address] Phone: [phone]   Fax:  [phone]  Name: Roshawnda Pecora MRN: [id number] Date of Birth: 27-Jul-1965    *Addendum to send recert  8:59 VU,34/14/4360 Elly Modena PT, [facility] [phone]

## 2015-12-07 NOTE — Addendum Note (Signed)
Addended by: Marylyn IshiharaKISER, Cobe Viney E on: 12/07/2015 06:44 PM   Modules accepted: Orders

## 2015-12-08 ENCOUNTER — Encounter (HOSPITAL_COMMUNITY): Payer: 59

## 2015-12-09 ENCOUNTER — Encounter (HOSPITAL_COMMUNITY): Payer: Self-pay | Admitting: Physical Therapy

## 2016-03-15 ENCOUNTER — Telehealth (HOSPITAL_COMMUNITY): Payer: Self-pay | Admitting: *Deleted

## 2016-03-15 NOTE — Telephone Encounter (Signed)
left voice message regarding an appointment. 

## 2016-03-21 ENCOUNTER — Telehealth (HOSPITAL_COMMUNITY): Payer: Self-pay | Admitting: *Deleted

## 2016-03-21 NOTE — Telephone Encounter (Signed)
left voice message regarding an appointment. 

## 2016-07-20 DIAGNOSIS — J45998 Other asthma: Secondary | ICD-10-CM | POA: Diagnosis not present

## 2016-07-20 DIAGNOSIS — K219 Gastro-esophageal reflux disease without esophagitis: Secondary | ICD-10-CM | POA: Diagnosis not present

## 2016-10-10 DIAGNOSIS — Z0000 Encounter for general adult medical examination without abnormal findings: Secondary | ICD-10-CM | POA: Diagnosis not present

## 2016-10-11 DIAGNOSIS — Z0001 Encounter for general adult medical examination with abnormal findings: Secondary | ICD-10-CM | POA: Diagnosis not present

## 2016-10-11 DIAGNOSIS — I10 Essential (primary) hypertension: Secondary | ICD-10-CM | POA: Diagnosis not present

## 2016-10-11 DIAGNOSIS — Z01419 Encounter for gynecological examination (general) (routine) without abnormal findings: Secondary | ICD-10-CM | POA: Diagnosis not present

## 2016-10-11 DIAGNOSIS — M545 Low back pain: Secondary | ICD-10-CM | POA: Diagnosis not present

## 2017-01-29 DIAGNOSIS — I10 Essential (primary) hypertension: Secondary | ICD-10-CM | POA: Diagnosis not present

## 2017-02-01 DIAGNOSIS — F5101 Primary insomnia: Secondary | ICD-10-CM | POA: Diagnosis not present

## 2017-02-01 DIAGNOSIS — I10 Essential (primary) hypertension: Secondary | ICD-10-CM | POA: Diagnosis not present

## 2017-02-01 DIAGNOSIS — K210 Gastro-esophageal reflux disease with esophagitis: Secondary | ICD-10-CM | POA: Diagnosis not present

## 2017-03-08 DIAGNOSIS — F5101 Primary insomnia: Secondary | ICD-10-CM | POA: Diagnosis not present

## 2017-03-08 DIAGNOSIS — K210 Gastro-esophageal reflux disease with esophagitis: Secondary | ICD-10-CM | POA: Diagnosis not present

## 2017-03-08 DIAGNOSIS — I10 Essential (primary) hypertension: Secondary | ICD-10-CM | POA: Diagnosis not present

## 2017-07-18 DIAGNOSIS — I10 Essential (primary) hypertension: Secondary | ICD-10-CM | POA: Diagnosis not present

## 2017-10-04 DIAGNOSIS — Z6824 Body mass index (BMI) 24.0-24.9, adult: Secondary | ICD-10-CM | POA: Diagnosis not present

## 2017-10-18 DIAGNOSIS — I10 Essential (primary) hypertension: Secondary | ICD-10-CM | POA: Diagnosis not present

## 2017-11-01 DIAGNOSIS — I10 Essential (primary) hypertension: Secondary | ICD-10-CM | POA: Diagnosis not present

## 2017-11-01 DIAGNOSIS — Z6824 Body mass index (BMI) 24.0-24.9, adult: Secondary | ICD-10-CM | POA: Diagnosis not present

## 2017-12-04 DIAGNOSIS — Z0001 Encounter for general adult medical examination with abnormal findings: Secondary | ICD-10-CM | POA: Diagnosis not present

## 2017-12-07 DIAGNOSIS — Z0000 Encounter for general adult medical examination without abnormal findings: Secondary | ICD-10-CM | POA: Diagnosis not present

## 2017-12-07 DIAGNOSIS — J45909 Unspecified asthma, uncomplicated: Secondary | ICD-10-CM | POA: Diagnosis not present

## 2018-04-16 ENCOUNTER — Encounter (HOSPITAL_COMMUNITY): Payer: Self-pay | Admitting: Physical Therapy

## 2018-08-05 DIAGNOSIS — I10 Essential (primary) hypertension: Secondary | ICD-10-CM | POA: Diagnosis not present

## 2018-08-05 DIAGNOSIS — D696 Thrombocytopenia, unspecified: Secondary | ICD-10-CM | POA: Diagnosis not present

## 2018-08-05 DIAGNOSIS — J45909 Unspecified asthma, uncomplicated: Secondary | ICD-10-CM | POA: Diagnosis not present

## 2018-11-28 DIAGNOSIS — I10 Essential (primary) hypertension: Secondary | ICD-10-CM | POA: Diagnosis not present

## 2018-12-04 DIAGNOSIS — J45909 Unspecified asthma, uncomplicated: Secondary | ICD-10-CM | POA: Diagnosis not present

## 2018-12-04 DIAGNOSIS — I10 Essential (primary) hypertension: Secondary | ICD-10-CM | POA: Diagnosis not present

## 2018-12-04 DIAGNOSIS — E782 Mixed hyperlipidemia: Secondary | ICD-10-CM | POA: Diagnosis not present

## 2018-12-11 ENCOUNTER — Encounter: Payer: Self-pay | Admitting: Gastroenterology

## 2019-01-27 ENCOUNTER — Encounter: Payer: Self-pay | Admitting: Gastroenterology

## 2019-01-27 ENCOUNTER — Ambulatory Visit: Payer: 59 | Admitting: Gastroenterology

## 2019-01-27 ENCOUNTER — Telehealth: Payer: Self-pay | Admitting: Gastroenterology

## 2019-01-27 NOTE — Telephone Encounter (Signed)
Patient was a no show and letter sent  °

## 2019-01-27 NOTE — Progress Notes (Deleted)
Primary Care Physician:  Patient, No Pcp Per  Primary Gastroenterologist:    No chief complaint on file.   HPI:  Patty White is a 53 y.o. female here   Current Outpatient Medications  Medication Sig Dispense Refill  . albuterol (PROVENTIL HFA;VENTOLIN HFA) 108 (90 BASE) MCG/ACT inhaler Inhale 1-2 puffs into the lungs every 6 (six) hours as needed for wheezing. 1 Inhaler 0  . albuterol (PROVENTIL HFA;VENTOLIN HFA) 108 (90 BASE) MCG/ACT inhaler 1-2 puffs q 4-6 hrs 1 Inhaler 0  . aspirin-acetaminophen-caffeine (EXCEDRIN EXTRA STRENGTH) 568-127-51 MG per tablet Take 1 tablet by mouth as needed. For pain    . Aspirin-Acetaminophen-Caffeine (GOODY HEADACHE PO) Take 1 packet by mouth as needed. For pain    . HYDROcodone-acetaminophen (NORCO/VICODIN) 5-325 MG per tablet Take one-two tabs po q 4-6 hrs prn pain (Patient not taking: Reported on 10/20/2015) 20 tablet 0  . ibuprofen (ADVIL,MOTRIN) 200 MG tablet Take 600 mg by mouth every 6 (six) hours as needed. Reported on 10/20/2015    . naproxen (NAPROSYN) 500 MG tablet Take 1 tablet (500 mg total) by mouth 2 (two) times daily with a meal. (Patient not taking: Reported on 10/20/2015) 20 tablet 0  . naproxen sodium (ANAPROX) 220 MG tablet Take 220 mg by mouth as needed. Reported on 10/20/2015    . omeprazole (PRILOSEC) 20 MG capsule Take 20 mg by mouth at bedtime.     No current facility-administered medications for this visit.     Allergies as of 01/27/2019 - Review Complete 10/28/2015  Allergen Reaction Noted  . Iodine Anaphylaxis 03/31/2011  . Other Anaphylaxis 03/31/2011    Past Medical History:  Diagnosis Date  . Asthma     Past Surgical History:  Procedure Laterality Date  . arm surgery    . CESAREAN SECTION    . FRACTURE SURGERY      Family History  Problem Relation Age of Onset  . Cancer Other     Social History   Socioeconomic History  . Marital status: Divorced    Spouse name: Not on file  . Number of children: Not  on file  . Years of education: Not on file  . Highest education level: Not on file  Occupational History  . Not on file  Social Needs  . Financial resource strain: Not on file  . Food insecurity    Worry: Not on file    Inability: Not on file  . Transportation needs    Medical: Not on file    Non-medical: Not on file  Tobacco Use  . Smoking status: Never Smoker  . Smokeless tobacco: Never Used  Substance and Sexual Activity  . Alcohol use: Yes    Comment: occasionally  . Drug use: No  . Sexual activity: Yes    Birth control/protection: None  Lifestyle  . Physical activity    Days per week: Not on file    Minutes per session: Not on file  . Stress: Not on file  Relationships  . Social Herbalist on phone: Not on file    Gets together: Not on file    Attends religious service: Not on file    Active member of club or organization: Not on file    Attends meetings of clubs or organizations: Not on file    Relationship status: Not on file  . Intimate partner violence    Fear of current or ex partner: Not on file    Emotionally abused: Not on  file    Physically abused: Not on file    Forced sexual activity: Not on file  Other Topics Concern  . Not on file  Social History Narrative  . Not on file      ROS:  General: Negative for anorexia, weight loss, fever, chills, fatigue, weakness. Eyes: Negative for vision changes.  ENT: Negative for hoarseness, difficulty swallowing , nasal congestion. CV: Negative for chest pain, angina, palpitations, dyspnea on exertion, peripheral edema.  Respiratory: Negative for dyspnea at rest, dyspnea on exertion, cough, sputum, wheezing.  GI: See history of present illness. GU:  Negative for dysuria, hematuria, urinary incontinence, urinary frequency, nocturnal urination.  MS: Negative for joint pain, low back pain.  Derm: Negative for rash or itching.  Neuro: Negative for weakness, abnormal sensation, seizure, frequent  headaches, memory loss, confusion.  Psych: Negative for anxiety, depression, suicidal ideation, hallucinations.  Endo: Negative for unusual weight change.  Heme: Negative for bruising or bleeding. Allergy: Negative for rash or hives.    Physical Examination:  There were no vitals taken for this visit.   General: Well-nourished, well-developed in no acute distress.  Head: Normocephalic, atraumatic.   Eyes: Conjunctiva pink, no icterus. Mouth: Oropharyngeal mucosa moist and pink , no lesions erythema or exudate. Neck: Supple without thyromegaly, masses, or lymphadenopathy.  Lungs: Clear to auscultation bilaterally.  Heart: Regular rate and rhythm, no murmurs rubs or gallops.  Abdomen: Bowel sounds are normal, nontender, nondistended, no hepatosplenomegaly or masses, no abdominal bruits or    hernia , no rebound or guarding.   Rectal: *** Extremities: No lower extremity edema. No clubbing or deformities.  Neuro: Alert and oriented x 4 , grossly normal neurologically.  Skin: Warm and dry, no rash or jaundice.   Psych: Alert and cooperative, normal mood and affect.  Labs: ***  Imaging Studies: No results found.

## 2019-08-13 ENCOUNTER — Other Ambulatory Visit: Payer: Self-pay | Admitting: Internal Medicine

## 2019-08-13 ENCOUNTER — Other Ambulatory Visit (HOSPITAL_COMMUNITY): Payer: Self-pay | Admitting: Internal Medicine

## 2019-08-13 DIAGNOSIS — R748 Abnormal levels of other serum enzymes: Secondary | ICD-10-CM

## 2019-08-20 ENCOUNTER — Other Ambulatory Visit: Payer: Self-pay

## 2019-08-20 ENCOUNTER — Ambulatory Visit (HOSPITAL_COMMUNITY)
Admission: RE | Admit: 2019-08-20 | Discharge: 2019-08-20 | Disposition: A | Discharge status: Home / Self Care | Payer: 59 | Source: Ambulatory Visit | Attending: Internal Medicine | Admitting: Internal Medicine

## 2019-08-20 DIAGNOSIS — R748 Abnormal levels of other serum enzymes: Secondary | ICD-10-CM | POA: Diagnosis present

## 2021-03-17 ENCOUNTER — Encounter: Payer: Self-pay | Admitting: Internal Medicine

## 2021-07-25 NOTE — Progress Notes (Deleted)
Referring Provider: Benita Stabile, MD Primary Care Physician:  Benita Stabile, MD Primary Gastroenterologist:  Dr. Marletta Lor  No chief complaint on file.   HPI:   Patty White is a 56 y.o. female presenting today at the request of Benita Stabile, MD for GERD.    Past Medical History:  Diagnosis Date   Asthma     Past Surgical History:  Procedure Laterality Date   arm surgery     CESAREAN SECTION     FRACTURE SURGERY      Current Outpatient Medications  Medication Sig Dispense Refill   albuterol (PROVENTIL HFA;VENTOLIN HFA) 108 (90 BASE) MCG/ACT inhaler Inhale 1-2 puffs into the lungs every 6 (six) hours as needed for wheezing. 1 Inhaler 0   albuterol (PROVENTIL HFA;VENTOLIN HFA) 108 (90 BASE) MCG/ACT inhaler 1-2 puffs q 4-6 hrs 1 Inhaler 0   aspirin-acetaminophen-caffeine (EXCEDRIN EXTRA STRENGTH) 250-250-65 MG per tablet Take 1 tablet by mouth as needed. For pain     Aspirin-Acetaminophen-Caffeine (GOODY HEADACHE PO) Take 1 packet by mouth as needed. For pain     HYDROcodone-acetaminophen (NORCO/VICODIN) 5-325 MG per tablet Take one-two tabs po q 4-6 hrs prn pain (Patient not taking: Reported on 10/20/2015) 20 tablet 0   ibuprofen (ADVIL,MOTRIN) 200 MG tablet Take 600 mg by mouth every 6 (six) hours as needed. Reported on 10/20/2015     naproxen (NAPROSYN) 500 MG tablet Take 1 tablet (500 mg total) by mouth 2 (two) times daily with a meal. (Patient not taking: Reported on 10/20/2015) 20 tablet 0   naproxen sodium (ANAPROX) 220 MG tablet Take 220 mg by mouth as needed. Reported on 10/20/2015     omeprazole (PRILOSEC) 20 MG capsule Take 20 mg by mouth at bedtime.     No current facility-administered medications for this visit.    Allergies as of 07/27/2021 - Review Complete 10/28/2015  Allergen Reaction Noted   Iodine Anaphylaxis 03/31/2011   Other Anaphylaxis 03/31/2011    Family History  Problem Relation Age of Onset   Cancer Other     Social History   Socioeconomic  History   Marital status: Divorced    Spouse name: Not on file   Number of children: Not on file   Years of education: Not on file   Highest education level: Not on file  Occupational History   Not on file  Tobacco Use   Smoking status: Never   Smokeless tobacco: Never  Substance and Sexual Activity   Alcohol use: Yes    Comment: occasionally   Drug use: No   Sexual activity: Yes    Birth control/protection: None  Other Topics Concern   Not on file  Social History Narrative   Not on file   Social Determinants of Health   Financial Resource Strain: Not on file  Food Insecurity: Not on file  Transportation Needs: Not on file  Physical Activity: Not on file  Stress: Not on file  Social Connections: Not on file  Intimate Partner Violence: Not on file    Review of Systems: Gen: Denies any fever, chills, fatigue, weight loss, lack of appetite.  CV: Denies chest pain, heart palpitations, peripheral edema, syncope.  Resp: Denies shortness of breath at rest or with exertion. Denies wheezing or cough.  GI: Denies dysphagia or odynophagia. Denies jaundice, hematemesis, fecal incontinence. GU : Denies urinary burning, urinary frequency, urinary hesitancy MS: Denies joint pain, muscle weakness, cramps, or limitation of movement.  Derm: Denies rash, itching, dry skin  Psych: Denies depression, anxiety, memory loss, and confusion Heme: Denies bruising, bleeding, and enlarged lymph nodes.  Physical Exam: There were no vitals taken for this visit. General:   Alert and oriented. Pleasant and cooperative. Well-nourished and well-developed.  Head:  Normocephalic and atraumatic. Eyes:  Without icterus, sclera clear and conjunctiva pink.  Ears:  Normal auditory acuity. Nose:  No deformity, discharge,  or lesions. Mouth:  No deformity or lesions, oral mucosa pink.  Neck:  Supple, without mass or thyromegaly. Lungs:  Clear to auscultation bilaterally. No wheezes, rales, or rhonchi. No  distress.  Heart:  S1, S2 present without murmurs appreciated.  Abdomen:  +BS, soft, non-tender and non-distended. No HSM noted. No guarding or rebound. No masses appreciated.  Rectal:  Deferred  Msk:  Symmetrical without gross deformities. Normal posture. Pulses:  Normal pulses noted. Extremities:  Without clubbing or edema. Neurologic:  Alert and  oriented x4;  grossly normal neurologically. Skin:  Intact without significant lesions or rashes. Cervical Nodes:  No significant cervical adenopathy. Psych:  Alert and cooperative. Normal mood and affect. 1

## 2021-07-27 ENCOUNTER — Encounter: Payer: Self-pay | Admitting: Internal Medicine

## 2021-07-27 ENCOUNTER — Ambulatory Visit: Payer: 59 | Admitting: Gastroenterology

## 2021-08-30 ENCOUNTER — Encounter: Payer: Self-pay | Admitting: Internal Medicine

## 2021-09-05 ENCOUNTER — Other Ambulatory Visit (HOSPITAL_COMMUNITY): Payer: Self-pay | Admitting: Neurosurgery

## 2021-09-05 ENCOUNTER — Other Ambulatory Visit (HOSPITAL_COMMUNITY): Payer: Self-pay | Admitting: Family Medicine

## 2021-09-05 ENCOUNTER — Other Ambulatory Visit: Payer: Self-pay | Admitting: Family Medicine

## 2021-09-05 ENCOUNTER — Other Ambulatory Visit: Payer: Self-pay | Admitting: Neurosurgery

## 2021-09-05 DIAGNOSIS — R945 Abnormal results of liver function studies: Secondary | ICD-10-CM

## 2021-09-05 DIAGNOSIS — M542 Cervicalgia: Secondary | ICD-10-CM

## 2021-09-22 ENCOUNTER — Other Ambulatory Visit: Payer: Self-pay

## 2021-09-22 ENCOUNTER — Encounter (HOSPITAL_COMMUNITY)
Admission: RE | Admit: 2021-09-22 | Discharge: 2021-09-22 | Disposition: A | Discharge status: Home / Self Care | Payer: 59 | Source: Ambulatory Visit | Attending: Family Medicine | Admitting: Family Medicine

## 2021-09-22 ENCOUNTER — Ambulatory Visit (HOSPITAL_COMMUNITY)
Admission: RE | Admit: 2021-09-22 | Discharge: 2021-09-22 | Disposition: A | Discharge status: Home / Self Care | Payer: 59 | Source: Ambulatory Visit | Attending: Neurosurgery | Admitting: Neurosurgery

## 2021-09-22 DIAGNOSIS — M542 Cervicalgia: Secondary | ICD-10-CM | POA: Insufficient documentation

## 2021-09-22 DIAGNOSIS — R945 Abnormal results of liver function studies: Secondary | ICD-10-CM | POA: Diagnosis present

## 2021-11-24 ENCOUNTER — Other Ambulatory Visit (HOSPITAL_COMMUNITY): Payer: Self-pay | Admitting: Family Medicine

## 2021-11-24 DIAGNOSIS — Z1231 Encounter for screening mammogram for malignant neoplasm of breast: Secondary | ICD-10-CM

## 2021-12-06 NOTE — Progress Notes (Unsigned)
Referring Provider: Benita Stabile, MD Primary Care Physician:  Leone Payor, FNP Primary Gastroenterologist:  Dr. Marletta Lor  No chief complaint on file.   HPI:   Patty White is a 56 y.o. female presenting today at the request of  Benita Stabile, MD for GERD.    Recent RUQ [place] 09/22/21: Cholelithiasis. No additional sonographic findings to suggest acute cholecystitis. Of note, mobile 2.0 cm gallstone is positioned at the gallbladder neck and may potentially be creating in intermittent ball valve obstruction. 2. Echogenic liver. Findings most commonly seen in hepatic steatosis, though may also represent hepatitis and/or fibrosis.  Today:    Past Medical History:  Diagnosis Date   Asthma     Past Surgical History:  Procedure Laterality Date   arm surgery     CESAREAN SECTION     FRACTURE SURGERY      Current Outpatient Medications  Medication Sig Dispense Refill   albuterol (PROVENTIL HFA;VENTOLIN HFA) 108 (90 BASE) MCG/ACT inhaler Inhale 1-2 puffs into the lungs every 6 (six) hours as needed for wheezing. 1 Inhaler 0   albuterol (PROVENTIL HFA;VENTOLIN HFA) 108 (90 BASE) MCG/ACT inhaler 1-2 puffs q 4-6 hrs 1 Inhaler 0   aspirin-acetaminophen-caffeine (EXCEDRIN EXTRA STRENGTH) 250-250-65 MG per tablet Take 1 tablet by mouth as needed. For pain     Aspirin-Acetaminophen-Caffeine (GOODY HEADACHE PO) Take 1 packet by mouth as needed. For pain     HYDROcodone-acetaminophen (NORCO/VICODIN) 5-325 MG per tablet Take one-two tabs po q 4-6 hrs prn pain (Patient not taking: Reported on 10/20/2015) 20 tablet 0   ibuprofen (ADVIL,MOTRIN) 200 MG tablet Take 600 mg by mouth every 6 (six) hours as needed. Reported on 10/20/2015     naproxen (NAPROSYN) 500 MG tablet Take 1 tablet (500 mg total) by mouth 2 (two) times daily with a meal. (Patient not taking: Reported on 10/20/2015) 20 tablet 0   naproxen sodium (ANAPROX) 220 MG tablet Take 220 mg by mouth as needed. Reported on 10/20/2015      omeprazole (PRILOSEC) 20 MG capsule Take 20 mg by mouth at bedtime.     No current facility-administered medications for this visit.    Allergies as of 12/08/2021 - Review Complete 10/28/2015  Allergen Reaction Noted   Iodine Anaphylaxis 03/31/2011   Other Anaphylaxis 03/31/2011    Family History  Problem Relation Age of Onset   Cancer Other     Social History   Socioeconomic History   Marital status: Divorced    Spouse name: Not on file   Number of children: Not on file   Years of education: Not on file   Highest education level: Not on file  Occupational History   Not on file  Tobacco Use   Smoking status: Never   Smokeless tobacco: Never  Substance and Sexual Activity   Alcohol use: Yes    Comment: occasionally   Drug use: No   Sexual activity: Yes    Birth control/protection: None  Other Topics Concern   Not on file  Social History Narrative   Not on file   Social Determinants of Health   Financial Resource Strain: Not on file  Food Insecurity: Not on file  Transportation Needs: Not on file  Physical Activity: Not on file  Stress: Not on file  Social Connections: Not on file  Intimate Partner Violence: Not on file    Review of Systems: Gen: Denies any fever, chills, fatigue, weight loss, lack of appetite.  CV: Denies chest pain, heart  palpitations, peripheral edema, syncope.  Resp: Denies shortness of breath at rest or with exertion. Denies wheezing or cough.  GI: Denies dysphagia or odynophagia. Denies jaundice, hematemesis, fecal incontinence. GU : Denies urinary burning, urinary frequency, urinary hesitancy MS: Denies joint pain, muscle weakness, cramps, or limitation of movement.  Derm: Denies rash, itching, dry skin Psych: Denies depression, anxiety, memory loss, and confusion Heme: Denies bruising, bleeding, and enlarged lymph nodes.  Physical Exam: There were no vitals taken for this visit. General:   Alert and oriented. Pleasant and  cooperative. Well-nourished and well-developed.  Head:  Normocephalic and atraumatic. Eyes:  Without icterus, sclera clear and conjunctiva pink.  Ears:  Normal auditory acuity. Lungs:  Clear to auscultation bilaterally. No wheezes, rales, or rhonchi. No distress.  Heart:  S1, S2 present without murmurs appreciated.  Abdomen:  +BS, soft, non-tender and non-distended. No HSM noted. No guarding or rebound. No masses appreciated.  Rectal:  Deferred  Msk:  Symmetrical without gross deformities. Normal posture. Extremities:  Without edema. Neurologic:  Alert and  oriented x4;  grossly normal neurologically. Skin:  Intact without significant lesions or rashes. Psych:  Normal mood and affect.    Assessment:     Plan:  ***   Ermalinda Memos, PA-C [facility] 12/08/2021

## 2021-12-08 ENCOUNTER — Encounter: Payer: Self-pay | Admitting: Gastroenterology

## 2021-12-08 ENCOUNTER — Telehealth: Payer: Self-pay

## 2021-12-08 ENCOUNTER — Ambulatory Visit: Payer: 59 | Admitting: Gastroenterology

## 2021-12-08 VITALS — BP 146/80 | HR 94 | Temp 97.6°F | Ht 68.0 in | Wt 155.6 lb

## 2021-12-08 DIAGNOSIS — R7989 Other specified abnormal findings of blood chemistry: Secondary | ICD-10-CM | POA: Diagnosis not present

## 2021-12-08 DIAGNOSIS — K760 Fatty (change of) liver, not elsewhere classified: Secondary | ICD-10-CM | POA: Diagnosis not present

## 2021-12-08 DIAGNOSIS — R1011 Right upper quadrant pain: Secondary | ICD-10-CM

## 2021-12-08 DIAGNOSIS — R1310 Dysphagia, unspecified: Secondary | ICD-10-CM

## 2021-12-08 DIAGNOSIS — K219 Gastro-esophageal reflux disease without esophagitis: Secondary | ICD-10-CM | POA: Diagnosis not present

## 2021-12-08 MED ORDER — DEXLANSOPRAZOLE 60 MG PO CPDR: 60.0000 mg | DELAYED_RELEASE_CAPSULE | Freq: Every day | ORAL | 3 refills | Status: DC

## 2021-12-08 NOTE — Patient Instructions (Signed)
Please have blood work completed at [facility].  We will arrange to have an upper endoscopy with possible stretching of your esophagus in near future with Dr. Marletta Lor.  Please stop naproxen and ibuprofen as these medications are going to worsen your upper GI symptoms.  Follow-up with your neurologist in [place] and/or PCP to discuss options for pain management related to scoliosis.  Stop omeprazole and start Dexilant 60 mg daily for reflux.  Follow a GERD diet:  Avoid fried, fatty, greasy, spicy, citrus foods. Avoid caffeine and carbonated beverages. Avoid chocolate. Try eating 4-6 small meals a day rather than 3 large meals. Do not eat within 3 hours of laying down. Prop head of bed up on wood or bricks to create a 6 inch incline.  Swallowing precautions:  Eat slowly, take small bites, chew thoroughly, drink plenty of liquids throughout meals.  Avoid trough textures All meats should be chopped finely.  If something gets hung in your esophagus and will not come up or go down, proceed to the emergency room.    For elevated liver enzymes:  We are going to complete an extensive work-up for you to rule out underlying causes such as viral hepatitis or autoimmune conditions. I would recommend for you to stop drinking alcohol for now as we are working this up. You should also stop drinking herbal teas for now.  I will plan to see you back in the office after your upper endoscopy.  Do not hesitate to call if you have any questions or concerns prior to next visit.  It was good to meet you today!   Ermalinda Memos, PA-C [facility]

## 2021-12-08 NOTE — Telephone Encounter (Signed)
Called pt, EGD/-/+DIL ASA 2 w/Dr. Marletta Lor scheduled for 12/20/21 at 2:30pm. Instructions given on phone and mailed. Orders entered.  PA for EGD/DIL submitted via [facility] website. Members's plan does not currently require PA for these services. Decision ID# [id number].

## 2021-12-08 NOTE — Telephone Encounter (Signed)
Called [employer] for PA, rep advised me to call [phone]. Call [phone].  Called number provided by [facility] rep, spoke to Clyde Lundborg. No PA needed for procedure. Call ref# [id number].

## 2021-12-20 ENCOUNTER — Encounter (HOSPITAL_COMMUNITY)
Admission: RE | Disposition: A | Discharge status: Home / Self Care | Payer: Self-pay | Source: Home / Self Care | Attending: Internal Medicine

## 2021-12-20 ENCOUNTER — Ambulatory Visit (HOSPITAL_COMMUNITY): Payer: 59 | Admitting: Certified Registered Nurse Anesthetist

## 2021-12-20 ENCOUNTER — Encounter: Payer: Self-pay | Admitting: Internal Medicine

## 2021-12-20 ENCOUNTER — Ambulatory Visit (HOSPITAL_COMMUNITY)
Admission: RE | Admit: 2021-12-20 | Discharge: 2021-12-20 | Disposition: A | Discharge status: Home / Self Care | Payer: 59 | Source: Home / Self Care | Attending: Internal Medicine | Admitting: Internal Medicine

## 2021-12-20 ENCOUNTER — Telehealth: Payer: Self-pay | Admitting: *Deleted

## 2021-12-20 ENCOUNTER — Encounter (HOSPITAL_COMMUNITY): Payer: Self-pay

## 2021-12-20 DIAGNOSIS — R1011 Right upper quadrant pain: Secondary | ICD-10-CM

## 2021-12-20 LAB — HEAT SHOCK PROTEIN 70 AUTOABS: HSP 70 ANTIBODY: NEGATIVE

## 2021-12-20 LAB — TEST AUTHORIZATION 2

## 2021-12-20 LAB — IRON,TIBC AND FERRITIN PANEL
%SAT: 18 % (calc) (ref 16–45)
Ferritin: 25 ng/mL (ref 16–232)
Iron: 68 ug/dL (ref 45–160)
TIBC: 382 mcg/dL (calc) (ref 250–450)

## 2021-12-20 LAB — HEPATITIS C ANTIBODY
Hepatitis C Ab: NONREACTIVE
SIGNAL TO CUT-OFF: 0.2 (ref <1.00)

## 2021-12-20 LAB — ANTI-NUCLEAR AB-TITER (ANA TITER): ANA Titer 1: 1:320 {titer} — ABNORMAL HIGH

## 2021-12-20 LAB — IGG, IGA, IGM
IgG (Immunoglobin G), Serum: 1255 mg/dL (ref 600–1640)
IgM, Serum: 91 mg/dL (ref 50–300)
Immunoglobulin A: 169 mg/dL (ref 47–310)

## 2021-12-20 LAB — TEST AUTHORIZATION

## 2021-12-20 LAB — ADD ON HEP FUNCT PNL
AG Ratio: 1.5 (calc) (ref 1.0–2.5)
ALT: 28 U/L (ref 6–29)
AST: 29 U/L (ref 10–35)
Albumin: 4.2 g/dL (ref 3.6–5.1)
Alkaline phosphatase (APISO): 187 U/L — ABNORMAL HIGH (ref 37–153)
Bilirubin, Direct: 0.1 mg/dL (ref 0.0–0.2)
Globulin: 2.8 g/dL (calc) (ref 1.9–3.7)
Indirect Bilirubin: 0.3 mg/dL (calc) (ref 0.2–1.2)
Total Bilirubin: 0.4 mg/dL (ref 0.2–1.2)
Total Protein: 7 g/dL (ref 6.1–8.1)

## 2021-12-20 LAB — HEPATITIS B SURFACE ANTIGEN: Hepatitis B Surface Ag: NONREACTIVE

## 2021-12-20 LAB — ANTI-SMOOTH MUSCLE ANTIBODY, IGG: Actin (Smooth Muscle) Antibody (IGG): 71 U — ABNORMAL HIGH (ref <20)

## 2021-12-20 LAB — HEPATITIS B SURFACE ANTIBODY,QUALITATIVE: Hep B S Ab: NONREACTIVE

## 2021-12-20 LAB — HEPATITIS A ANTIBODY, TOTAL: Hepatitis A AB,Total: NONREACTIVE

## 2021-12-20 LAB — MITOCHONDRIAL ANTIBODIES: Mitochondrial M2 Ab, IgG: <=20 U (ref <=20.0)

## 2021-12-20 LAB — HEPATITIS B CORE ANTIBODY, TOTAL: Hep B Core Total Ab: NONREACTIVE

## 2021-12-20 LAB — ANA: Anti Nuclear Antibody (ANA): POSITIVE — AB

## 2021-12-20 SURGERY — ESOPHAGOGASTRODUODENOSCOPY (EGD) WITH PROPOFOL
Anesthesia: Monitor Anesthesia Care

## 2021-12-20 NOTE — Telephone Encounter (Signed)
Received call from endo. Pt procedure was cancelled today. She did not follow instructions. Pt advised Shawna Orleans she did not need procedure done anyways. Her issue is her gallbaldder.  Pt then called to office stating same thing that her problem is her gallbladder. Please advise Dr. Marletta Lor

## 2021-12-20 NOTE — Progress Notes (Signed)
Patient arrived for an EGD/possible dilation. Patient arrived with a bottle of water. Stated she had been drinking tea. Patient has not been NPO for procedure. Procedure cancelled due to patient not being NPO. Patient stated she did not need this procedure anyways. Stated she needs gallbladder out. Denies having any dysphagia. Dr. Abbey Chatters notified. Stated he will refer patient to surgeon for cholecystectomy. Mindy notified of patient's procedure being cancelled today.

## 2021-12-21 NOTE — Telephone Encounter (Signed)
Please refer to [facility].  Thank you

## 2021-12-22 ENCOUNTER — Telehealth: Payer: Self-pay | Admitting: *Deleted

## 2021-12-22 NOTE — Addendum Note (Signed)
Addended by: Armstead Peaks on: 12/22/2021 07:27 AM   Modules accepted: Orders

## 2021-12-22 NOTE — Telephone Encounter (Signed)
Referral sent 

## 2021-12-22 NOTE — Telephone Encounter (Signed)
Spoke to pt, informed her of results and recommendations. Pt voiced understanding. Pt informed me that she did not have procedure done on 6 /13 because she did not need that. It was her gallbladder that was giving her problems. She has been referred to a Development worker, international aid.

## 2021-12-23 NOTE — Telephone Encounter (Signed)
Noted. I also received notification from Dr. Marletta Lor that patient presented to have her EGD, but had been drinking liquids all morning stating she would not be able to go more than 2 hours without drinking. She also reported she was not having dysphagia (which she had previously reported at her OV) and stated she did not need an EGD, but would like a referral to general surgery to have her gallbladder removed. Appears a referral was placed yesterday to general surgery per Dr. Marletta Lor.    We can plan to follow-up in 3 months or sooner if needed.   Stacey: Please arrange OV in 3 months.

## 2021-12-26 NOTE — Telephone Encounter (Signed)
Noted  

## 2021-12-27 NOTE — H&P (Signed)
Patient presented for EGD.  Has been drinking liquids all day.  Procedure canceled.

## 2022-01-17 ENCOUNTER — Ambulatory Visit: Payer: 59 | Admitting: General Surgery

## 2022-01-17 ENCOUNTER — Encounter: Payer: Self-pay | Admitting: General Surgery

## 2022-01-17 VITALS — BP 136/85 | HR 75 | Temp 98.0°F | Resp 14 | Ht 68.0 in | Wt 154.0 lb

## 2022-01-17 DIAGNOSIS — K760 Fatty (change of) liver, not elsewhere classified: Secondary | ICD-10-CM | POA: Diagnosis not present

## 2022-01-17 DIAGNOSIS — K8020 Calculus of gallbladder without cholecystitis without obstruction: Secondary | ICD-10-CM

## 2022-01-17 NOTE — Patient Instructions (Signed)
Minimally Invasive Cholecystectomy Minimally invasive cholecystectomy is surgery to remove the gallbladder. The gallbladder is a pear-shaped organ that lies beneath the liver on the right side of the body. The gallbladder stores bile, which is a fluid that helps the body digest fats. Cholecystectomy is often done to treat inflammation (irritation and swelling) of the gallbladder (cholecystitis). This condition is usually caused by a buildup of gallstones (cholelithiasis) in the gallbladder or when the fluid in the gall bladder becomes stagnant because gallstones get stuck in the ducts (tubes) and block the flow of bile. This can result in inflammation and pain. In severe cases, emergency surgery may be required. This procedure is done through small incisions in the abdomen, instead of one large incision. It is also called laparoscopic surgery. A thin scope with a camera (laparoscope) is inserted through one incision. Then surgical instruments are inserted through the other incisions. In some cases, a minimally invasive surgery may need to be changed to a surgery that is done through a larger incision. This is called open surgery. Tell a health care provider about: Any allergies you have. All medicines you are taking, including vitamins, herbs, eye drops, creams, and over-the-counter medicines. Any problems you or family members have had with anesthetic medicines. Any bleeding problems you have. Any surgeries you have had. Any medical conditions you have. Whether you are pregnant or may be pregnant. What are the risks? Generally, this is a safe procedure. However, problems may occur, including: Infection. Bleeding. Allergic reactions to medicines. Damage to nearby structures or organs. A gallstone remaining in the common bile duct. The common bile duct carries bile from the gallbladder to the small intestine. A bile leak from the liver or cystic duct after your gallbladder is removed. What happens  before the procedure? Medicines Ask your health care provider about: Changing or stopping your regular medicines. This is especially important if you are taking diabetes medicines or blood thinners. Taking medicines such as aspirin and ibuprofen. These medicines can thin your blood. Do not take these medicines unless your health care provider tells you to take them. Taking over-the-counter medicines, vitamins, herbs, and supplements. General instructions If you will be going home right after the procedure, plan to have a responsible adult: Take you home from the hospital or clinic. You will not be allowed to drive. Care for you for the time you are told. Do not use any products that contain nicotine or tobacco for at least 4 weeks before the procedure. These products include cigarettes, chewing tobacco, and vaping devices, such as e-cigarettes. If you need help quitting, ask your health care provider. Ask your health care provider: How your surgery site will be marked. What steps will be taken to help prevent infection. These may include: Removing hair at the surgery site. Washing skin with a germ-killing soap. Taking antibiotic medicine. What happens during the procedure?  An IV will be inserted into one of your veins. You will be given one or both of the following: A medicine to help you relax (sedative). A medicine to make you fall asleep (general anesthetic). Your surgeon will make several small incisions in your abdomen. The laparoscope will be inserted through one of the small incisions. The camera on the laparoscope will send images to a monitor in the operating room. This lets your surgeon see inside your abdomen. A gas will be pumped into your abdomen. This will expand your abdomen to give the surgeon more room to perform the surgery. Other tools that   are needed for the procedure will be inserted through the other incisions. The gallbladder will be removed through one of the  incisions. Your common bile duct may be examined. If stones are found in the common bile duct, they may be removed. After your gallbladder has been removed, the incisions will be closed with stitches (sutures), staples, or skin glue. Your incisions will be covered with a bandage (dressing). The procedure may vary among health care providers and hospitals. What happens after the procedure? Your blood pressure, heart rate, breathing rate, and blood oxygen level will be monitored until you leave the hospital or clinic. You will be given medicines as needed to control your pain. You may have a drain placed in the incision. The drain will be removed a day or two after the procedure. Summary Minimally invasive cholecystectomy, also called laparoscopic cholecystectomy, is surgery to remove the gallbladder using small incisions. Tell your health care provider about all the medical conditions you have and all the medicines you are taking for those conditions. Before the procedure, follow instructions about when to stop eating and drinking and changing or stopping medicines. Plan to have a responsible adult care for you for the time you are told after you leave the hospital or clinic. This information is not intended to replace advice given to you by your health care provider. Make sure you discuss any questions you have with your health care provider. Document Revised: 12/28/2020 Document Reviewed: 12/28/2020 Elsevier Patient Education  2023 Elsevier Inc.  

## 2022-01-17 NOTE — Progress Notes (Signed)
[facility] History and Physical  Reason for Referral:*** Referring Physician: ***  Chief Complaint   New Patient (Initial Visit)     Patty White is a 56 y.o. female.  HPI: ***.  The *** started *** and has had a duration of ***.  It is associated with ***.  The *** is improved with ***, and is made worse with ***.    Quality*** Context***  Past Medical History:  Diagnosis Date  . Anxiety   . Asthma   . GERD (gastroesophageal reflux disease)   . Hidradenitis suppurativa   . HTN (hypertension)   . Scoliosis     Past Surgical History:  Procedure Laterality Date  . arm surgery    . CESAREAN SECTION    . FRACTURE SURGERY      Family History  Problem Relation Age of Onset  . Cancer Other   . Colon cancer Neg Hx     Social History   Tobacco Use  . Smoking status: Never  . Smokeless tobacco: Never  Substance Use Topics  . Alcohol use: Yes    Comment: 2 glasses of red wine nightly.  . Drug use: No    Medications: {medication reviewed/display:3041432} Allergies as of 01/17/2022       Reactions   Iodine Anaphylaxis   Other Anaphylaxis   Shell fish        Medication List        Accurate as of January 17, 2022 11:10 AM. If you have any questions, ask your nurse or doctor.          STOP taking these medications    dexlansoprazole 60 MG capsule Commonly known as: Dexilant Stopped by: Patty Roers, MD   naproxen sodium 220 MG tablet Commonly known as: ALEVE Stopped by: Patty Roers, MD       TAKE these medications    albuterol 108 (90 Base) MCG/ACT inhaler Commonly known as: VENTOLIN HFA 1-2 puffs q 4-6 hrs What changed:  how much to take when to take this reasons to take this additional instructions   b complex vitamins capsule Take 1 capsule by mouth daily.   busPIRone 30 MG tablet Commonly known as: BUSPAR Take 30 mg by mouth 2 (two) times daily.   cetirizine 10 MG tablet Commonly known as:  ZYRTEC Take 10 mg by mouth daily.   clonazePAM 1 MG tablet Commonly known as: KLONOPIN Take 1 mg by mouth at bedtime as needed (sleep).   DULoxetine 60 MG capsule Commonly known as: CYMBALTA Take 60 mg by mouth daily.   Humira Pediatric Crohns Start 80 MG/0.8ML & 40MG /0.4ML Pskt Generic drug: Adalimumab Inject 80 mg into the skin every 14 (fourteen) days.   ibuprofen 200 MG tablet Commonly known as: ADVIL Take 800 mg by mouth every 6 (six) hours as needed for mild pain, headache or moderate pain. Reported on 10/20/2015   naproxen 500 MG tablet Commonly known as: NAPROSYN Take 1 tablet (500 mg total) by mouth 2 (two) times daily with a meal. What changed:  how much to take when to take this reasons to take this   olmesartan 40 MG tablet Commonly known as: BENICAR Take 40 mg by mouth daily.   omeprazole 40 MG capsule Commonly known as: PRILOSEC Take 40 mg by mouth 3 (three) times daily.   oxymetazoline 0.05 % nasal spray Commonly known as: AFRIN Place 1 spray into both nostrils 2 (two) times daily.   triamcinolone cream 0.1 % Commonly known  as: KENALOG Apply 1 application. topically once a week.         ROS:  {Review of Systems:30496}  Blood pressure 136/85, pulse 75, temperature 98 F (36.7 C), temperature source Oral, resp. rate 14, height 5\' 8"  (1.727 m), weight 154 lb (69.9 kg), last menstrual period 05/31/2014, SpO2 99 %. Physical Exam  Results: CLINICAL DATA:  Abnormal results of liver function studies   EXAM: ULTRASOUND ABDOMEN LIMITED RIGHT UPPER QUADRANT   COMPARISON:  06/02/2014 Abdomen, 08/20/2019.   FINDINGS: Gallbladder:   Mobile, dependent echogenic gallstone within a minimally distended gallbladder measuring up to 2.0 cm in greatest dimension.   No wall thickening visualized. No sonographic Murphy sign noted by sonographer.   Common bile duct:   Diameter: 0.3 cm   Liver:   No focal lesion identified. Increased hepatic  parenchymal echogenicity. Portal vein is patent on color Doppler imaging with normal direction of blood flow towards the liver.   Other: None.   IMPRESSION: 1. Cholelithiasis. No additional sonographic findings to suggest acute cholecystitis. Of note, mobile 2.0 cm gallstone is positioned at the gallbladder neck and may potentially be creating in intermittent ball valve obstruction. 2. Echogenic liver. Findings most commonly seen in hepatic steatosis, though may also represent hepatitis and/or fibrosis.     Electronically Signed   By: 10/18/2019 M.D.   On: 09/24/2021 09:46  Assessment & Plan:  Patty White is a 56 y.o. female with *** -*** -*** -Follow up ***  All questions were answered to the satisfaction of the patient and family***.  The risk and benefits of *** were discussed including but not limited to ***.  After careful consideration, Patty White has decided to ***.    Patty White 01/17/2022, 11:10 AM

## 2022-01-19 DIAGNOSIS — K8020 Calculus of gallbladder without cholecystitis without obstruction: Secondary | ICD-10-CM

## 2022-01-19 NOTE — H&P (Signed)
[facility] History and Physical  Reason for Referral: Gallstones  Referring Physician: Leone Payor, FNP    Chief Complaint   New Patient (Initial Visit)     Patty White is a 56 y.o. female.  HPI: Patty White is a 56 yo who has been having issues with epigastric pain and pain in her RUQ and associated nausea and bloating. She was suppose to have a EGD for some of her issues but ended up and had to cancel it due to needing to be NPO until after 2pm. She says that she does not have issues with swallowing and that was why the EGD was being done. She takes Humira for Hidradenitis and is due to on Friday.    Past Medical History:  Diagnosis Date   Anxiety    Asthma    GERD (gastroesophageal reflux disease)    Hidradenitis suppurativa    HTN (hypertension)    Scoliosis     Past Surgical History:  Procedure Laterality Date   arm surgery     CESAREAN SECTION     FRACTURE SURGERY      Family History  Problem Relation Age of Onset   Cancer Other    Colon cancer Neg Hx     Social History   Tobacco Use   Smoking status: Never   Smokeless tobacco: Never  Substance Use Topics   Alcohol use: Yes    Comment: 2 glasses of red wine nightly.   Drug use: No    Medications: I have reviewed the patient's current medications. Allergies as of 01/17/2022       Reactions   Iodine Anaphylaxis   Other Anaphylaxis   Shell fish        Medication List        Accurate as of January 17, 2022 11:10 AM. If you have any questions, ask your nurse or doctor.          STOP taking these medications    dexlansoprazole 60 MG capsule Commonly known as: Dexilant Stopped by: Lucretia Roers, MD   naproxen sodium 220 MG tablet Commonly known as: ALEVE Stopped by: Lucretia Roers, MD       TAKE these medications    albuterol 108 (90 Base) MCG/ACT inhaler Commonly known as: VENTOLIN HFA 1-2 puffs q 4-6 hrs What changed:  how much to take when to  take this reasons to take this additional instructions   b complex vitamins capsule Take 1 capsule by mouth daily.   busPIRone 30 MG tablet Commonly known as: BUSPAR Take 30 mg by mouth 2 (two) times daily.   cetirizine 10 MG tablet Commonly known as: ZYRTEC Take 10 mg by mouth daily.   clonazePAM 1 MG tablet Commonly known as: KLONOPIN Take 1 mg by mouth at bedtime as needed (sleep).   DULoxetine 60 MG capsule Commonly known as: CYMBALTA Take 60 mg by mouth daily.   Humira Pediatric Crohns Start 80 MG/0.8ML & 40MG /0.4ML Pskt Generic drug: Adalimumab Inject 80 mg into the skin every 14 (fourteen) days.   ibuprofen 200 MG tablet Commonly known as: ADVIL Take 800 mg by mouth every 6 (six) hours as needed for mild pain, headache or moderate pain. Reported on 10/20/2015   naproxen 500 MG tablet Commonly known as: NAPROSYN Take 1 tablet (500 mg total) by mouth 2 (two) times daily with a meal. What changed:  how much to take when to take this reasons to take this   olmesartan 40 MG  tablet Commonly known as: BENICAR Take 40 mg by mouth daily.   omeprazole 40 MG capsule Commonly known as: PRILOSEC Take 40 mg by mouth 3 (three) times daily.   oxymetazoline 0.05 % nasal spray Commonly known as: AFRIN Place 1 spray into both nostrils 2 (two) times daily.   triamcinolone cream 0.1 % Commonly known as: KENALOG Apply 1 application. topically once a week.         ROS:  A comprehensive review of systems was negative except for: Gastrointestinal: positive for abdominal pain, nausea, and reflux symptoms Genitourinary: positive for frequency Musculoskeletal: positive for back pain and neck pain Neurological: positive for headaches  Blood pressure 136/85, pulse 75, temperature 98 F (36.7 C), temperature source Oral, resp. rate 14, height 5\' 8"  (1.727 m), weight 154 lb (69.9 kg), last menstrual period 05/31/2014, SpO2 99 %. Physical Exam Vitals reviewed.   Constitutional:      Appearance: Normal appearance.  HENT:     Head: Normocephalic.     Nose: Nose normal.     Mouth/Throat:     Mouth: Mucous membranes are moist.  Eyes:     Extraocular Movements: Extraocular movements intact.  Cardiovascular:     Rate and Rhythm: Normal rate.  Pulmonary:     Effort: Pulmonary effort is normal.     Breath sounds: Normal breath sounds.  Abdominal:     General: There is no distension.     Palpations: Abdomen is soft.     Tenderness: There is abdominal tenderness in the right upper quadrant and epigastric area.  Musculoskeletal:        General: Normal range of motion.     Cervical back: Normal range of motion.  Skin:    General: Skin is warm.  Neurological:     General: No focal deficit present.     Mental Status: She is alert.  Psychiatric:        Mood and Affect: Mood normal.        Behavior: Behavior normal.     Results: CLINICAL DATA:  Abnormal results of liver function studies   EXAM: ULTRASOUND ABDOMEN LIMITED RIGHT UPPER QUADRANT   COMPARISON:  06/02/2014 Abdomen, 08/20/2019.   FINDINGS: Gallbladder:   Mobile, dependent echogenic gallstone within a minimally distended gallbladder measuring up to 2.0 cm in greatest dimension.   No wall thickening visualized. No sonographic Murphy sign noted by sonographer.   Common bile duct:   Diameter: 0.3 cm   Liver:   No focal lesion identified. Increased hepatic parenchymal echogenicity. Portal vein is patent on color Doppler imaging with normal direction of blood flow towards the liver.   Other: None.   IMPRESSION: 1. Cholelithiasis. No additional sonographic findings to suggest acute cholecystitis. Of note, mobile 2.0 cm gallstone is positioned at the gallbladder neck and may potentially be creating in intermittent ball valve obstruction. 2. Echogenic liver. Findings most commonly seen in hepatic steatosis, though may also represent hepatitis and/or fibrosis.      Electronically Signed   By: 10/18/2019 M.D.   On: 09/24/2021 09:46  Assessment & Plan:  Patty White is a 56 y.o. female with gallstones and some fatty liver. She is having symptoms.  -PLAN: I counseled the patient about the indication, risks and benefits of laparoscopic cholecystectomy.  She understands there is a very small chance for bleeding, infection, injury to normal structures (including common bile duct), conversion to open surgery, persistent symptoms, evolution of postcholecystectomy diarrhea, need for secondary interventions, anesthesia reaction, cardiopulmonary  issues and other risks not specifically detailed here. I described the expected recovery, the plan for follow-up and the restrictions during the recovery phase.  All questions were answered. Discussed liver biopsy at the same time given the fatty liver.    All questions were answered to the satisfaction of the patient.   Lucretia Roers 01/17/2022, 11:10 AM

## 2022-01-23 NOTE — Patient Instructions (Signed)
Patty White  01/23/2022     @PREFPERIOPPHARMACY @   Your procedure is scheduled on  01/26/2022.   Report to 01/28/2022 at  0730  A.M.   Call this number if you have problems the morning of surgery:  [phone]   Remember:  Do not eat or drink after midnight.      Take these medicines the morning of surgery with A SIP OF WATER              buspar, zyrtec, cymbalta, prilosec.     Do not wear jewelry, make-up or nail polish.  Do not wear lotions, powders, or perfumes, or deodorant.  Do not shave 48 hours prior to surgery.  Men may shave face and neck.  Do not bring valuables to the hospital.  [facility] is not responsible for any belongings or valuables.  Contacts, dentures or bridgework may not be worn into surgery.  Leave your suitcase in the car.  After surgery it may be brought to your room.  For patients admitted to the hospital, discharge time will be determined by your treatment team.  Patients discharged the day of surgery will not be allowed to drive home and must have someone with them for 24 hours.    Special instructions:   DO NOT smoke tobacco or vape for 24 hours before your procedure.  Please read over the following fact sheets that you were given. Coughing and Deep Breathing, Surgical Site Infection Prevention, Anesthesia Post-op Instructions, and Care and Recovery After Surgery      Liver Biopsy, Care After After a liver biopsy, it is common to have these things in the area where the biopsy was done. You may: Have pain. Feel sore. Have bruising. You may also feel tired for a few days. Follow these instructions at home: Medicines Take over-the-counter and prescription medicines only as told by your doctor. If you were prescribed an antibiotic medicine, take it as told by your doctor. Do not stop taking the antibiotic, even if you start to feel better. Do not take medicines that may thin your blood. These medicines include  aspirin and ibuprofen. Take them only if your doctor tells you to. If told, take steps to prevent problems with pooping (constipation). You may need to: Drink enough fluid to keep your pee (urine) pale yellow. Take medicines. You will be told what medicines to take. Eat foods that are high in fiber. These include beans, whole grains, and fresh fruits and vegetables. Limit foods that are high in fat and sugar. These include fried or sweet foods. Ask your doctor if you should avoid driving or using machines while you are taking your medicine. Caring for your incision Follow instructions from your doctor about how to take care of your cut from surgery (incisions). Make sure you: Wash your hands with soap and water for at least 20 seconds before and after you change your bandage. If you cannot use soap and water, use hand sanitizer. Change your bandage. Leavestitches or skin glue in place for at least two weeks. Leave tape strips alone unless you are told to take them off. You may trim the edges of the tape strips if they curl up. Check your incision every day for signs of infection. Check for: Redness, swelling, or more pain. Fluid or blood. Warmth. Pus or a bad smell. Do not take baths, swim, or use a hot tub. Ask your doctor about taking showers  or sponge baths. Activity Rest at home for 1-2 days, or as told by your doctor. Get up to take short walks every 1 to 2 hours. Ask for help if you feel weak or unsteady. Do not lift anything that is heavier than 10 lb (4.5 kg), or the limit that you are told. Do not play contact sports for 2 weeks after the procedure. Return to your normal activities as told by your doctor. Ask what activities are safe for you. General instructions  Do not drink alcohol in the first week after the procedure. Plan to have a responsible adult care for you for the time you are told after you leave the hospital or clinic. This is important. It is up to you to get the  results of your procedure. Ask how to get your results when they are ready. Keep all follow-up visits. Contact a doctor if: You have more bleeding in your incision. Your incision swells, or is red and more painful. You have fluid that comes from your incision. You develop a rash. You have fever or chills. Get help right away if: You have swelling, bloating, or pain in your belly (abdomen). You get dizzy or faint. You vomit or you feel like vomiting. You have trouble breathing or feel short of breath. You have chest pain. You have problems talking or seeing. You have trouble with your balance or moving your arms or legs. These symptoms may be an emergency. Get help right away. Call your local emergency services (911 in the [place] Do not wait to see if the symptoms will go away. Do not drive yourself to the hospital. Summary After the procedure, it is common to have pain, soreness, bruising, and tiredness. Your doctor will tell you how to take care of yourself at home. Change your bandage, take your medicines, and limit your activities as told by your doctor. Call your doctor if you have symptoms of infection. Get help right away if your belly swells, your cut bleeds a lot, or you have trouble talking or breathing. This information is not intended to replace advice given to you by your health care provider. Make sure you discuss any questions you have with your health care provider. Document Revised: 05/10/2020 Document Reviewed: 05/10/2020 Elsevier Patient Education  2023 [employer] Minimally Invasive Cholecystectomy, Care After The following information offers guidance on how to care for yourself after your procedure. Your health care provider may also give you more specific instructions. If you have problems or questions, contact your health care provider. What can I expect after the procedure? After the procedure, it is common to have: Pain at your incision sites. You will be  given medicines to control this pain. Mild nausea or vomiting. Bloating and possible shoulder pain from the gas that was used during the procedure. Follow these instructions at home: Medicines Take over-the-counter and prescription medicines only as told by your health care provider. If you were prescribed an antibiotic medicine, take it as told by your health care provider. Do not stop using the antibiotic even if you start to feel better. Ask your health care provider if the medicine prescribed to you: Requires you to avoid driving or using machinery. Can cause constipation. You may need to take these actions to prevent or treat constipation: Drink enough fluid to keep your urine pale yellow. Take over-the-counter or prescription medicines. Eat foods that are high in fiber, such as beans, whole grains, and fresh fruits and vegetables. Limit foods that are  high in fat and processed sugars, such as fried or sweet foods. Incision care  Follow instructions from your health care provider about how to take care of your incisions. Make sure you: Wash your hands with soap and water for at least 20 seconds before and after you change your bandage (dressing). If soap and water are not available, use hand sanitizer. Change your dressing as told by your health care provider. Leave stitches (sutures), skin glue, or adhesive strips in place. These skin closures may need to be in place for 2 weeks or longer. If adhesive strip edges start to loosen and curl up, you may trim the loose edges. Do not remove adhesive strips completely unless your health care provider tells you to do that. Do not take baths, swim, or use a hot tub until your health care provider approves. Ask your health care provider if you may take showers. You may only be allowed to take sponge baths. Check your incision area every day for signs of infection. Check for: More redness, swelling, or pain. Fluid or blood. Warmth. Pus or a bad  smell. Activity Rest as told by your health care provider. Do not do activities that require a lot of effort. Avoid sitting for a long time without moving. Get up to take short walks every 1-2 hours. This is important to improve blood flow and breathing. Ask for help if you feel weak or unsteady. Do not lift anything that is heavier than 10 lb (4.5 kg), or the limit that you are told, until your health care provider says that it is safe. Do not play contact sports until your health care provider approves. Do not return to work or school until your health care provider approves. Return to your normal activities as told by your health care provider. Ask your health care provider what activities are safe for you. General instructions If you were given a sedative during the procedure, it can affect you for several hours. Do not drive or operate machinery until your health care provider says that it is safe. Keep all follow-up visits. This is important. Contact a health care provider if: You develop a rash. You have more redness, swelling, or pain around your incisions. You have fluid or blood coming from your incisions. Your incisions feel warm to the touch. You have pus or a bad smell coming from your incisions. You have a fever. One or more of your incisions breaks open. Get help right away if: You have trouble breathing. You have chest pain. You have more pain in your shoulders. You faint or feel dizzy when you stand. You have severe pain in your abdomen. You have nausea or vomiting that lasts for more than one day. You have leg pain that is new or unusual, or if it is localized to one specific spot. These symptoms may represent a serious problem that is an emergency. Do not wait to see if the symptoms will go away. Get medical help right away. Call your local emergency services (911 in the [place] Do not drive yourself to the hospital. Summary After your procedure, it is common to have  pain at the incision sites. You may also have nausea or bloating. Follow your health care provider's instructions about medicine, activity restrictions, and caring for your incision areas. Do not do activities that require a lot of effort. Contact a health care provider if you have a fever or other signs of infection, such as more redness, swelling, or pain around  the incisions. Get help right away if you have chest pain, increasing pain in the shoulders, or trouble breathing. This information is not intended to replace advice given to you by your health care provider. Make sure you discuss any questions you have with your health care provider. Document Revised: 12/28/2020 Document Reviewed: 12/28/2020 Elsevier Patient Education  2023 Else[employer] Inc. General Anesthesia, Adult, Care After This sheet gives you information about how to care for yourself after your procedure. Your health care provider may also give you more specific instructions. If you have problems or questions, contact your health care provider. What can I expect after the procedure? After the procedure, the following side effects are common: Pain or discomfort at the IV site. Nausea. Vomiting. Sore throat. Trouble concentrating. Feeling cold or chills. Feeling weak or tired. Sleepiness and fatigue. Soreness and body aches. These side effects can affect parts of the body that were not involved in surgery. Follow these instructions at home: For the time period you were told by your health care provider:  Rest. Do not participate in activities where you could fall or become injured. Do not drive or use machinery. Do not drink alcohol. Do not take sleeping pills or medicines that cause drowsiness. Do not make important decisions or sign legal documents. Do not take care of children on your own. Eating and drinking Follow any instructions from your health care provider about eating or drinking restrictions. When you feel  hungry, start by eating small amounts of foods that are soft and easy to digest (bland), such as toast. Gradually return to your regular diet. Drink enough fluid to keep your urine pale yellow. If you vomit, rehydrate by drinking water, juice, or clear broth. General instructions If you have sleep apnea, surgery and certain medicines can increase your risk for breathing problems. Follow instructions from your health care provider about wearing your sleep device: Anytime you are sleeping, including during daytime naps. While taking prescription pain medicines, sleeping medicines, or medicines that make you drowsy. Have a responsible adult stay with you for the time you are told. It is important to have someone help care for you until you are awake and alert. Return to your normal activities as told by your health care provider. Ask your health care provider what activities are safe for you. Take over-the-counter and prescription medicines only as told by your health care provider. If you smoke, do not smoke without supervision. Keep all follow-up visits as told by your health care provider. This is important. Contact a health care provider if: You have nausea or vomiting that does not get better with medicine. You cannot eat or drink without vomiting. You have pain that does not get better with medicine. You are unable to pass urine. You develop a skin rash. You have a fever. You have redness around your IV site that gets worse. Get help right away if: You have difficulty breathing. You have chest pain. You have blood in your urine or stool, or you vomit blood. Summary After the procedure, it is common to have a sore throat or nausea. It is also common to feel tired. Have a responsible adult stay with you for the time you are told. It is important to have someone help care for you until you are awake and alert. When you feel hungry, start by eating small amounts of foods that are soft and  easy to digest (bland), such as toast. Gradually return to your regular diet. Drink enough fluid  to keep your urine pale yellow. Return to your normal activities as told by your health care provider. Ask your health care provider what activities are safe for you. This information is not intended to replace advice given to you by your health care provider. Make sure you discuss any questions you have with your health care provider. Document Revised: 03/11/2020 Document Reviewed: 10/09/2019 Elsevier Patient Education  2023 Elsevier Inc. How to Use Chlorhexidine for Bathing Chlorhexidine gluconate (CHG) is a germ-killing (antiseptic) solution that is used to clean the skin. It can get rid of the bacteria that normally live on the skin and can keep them away for about 24 hours. To clean your skin with CHG, you may be given: A CHG solution to use in the shower or as part of a sponge bath. A prepackaged cloth that contains CHG. Cleaning your skin with CHG may help lower the risk for infection: While you are staying in the intensive care unit of the hospital. If you have a vascular access, such as a central line, to provide short-term or long-term access to your veins. If you have a catheter to drain urine from your bladder. If you are on a ventilator. A ventilator is a machine that helps you breathe by moving air in and out of your lungs. After surgery. What are the risks? Risks of using CHG include: A skin reaction. Hearing loss, if CHG gets in your ears and you have a perforated eardrum. Eye injury, if CHG gets in your eyes and is not rinsed out. The CHG product catching fire. Make sure that you avoid smoking and flames after applying CHG to your skin. Do not use CHG: If you have a chlorhexidine allergy or have previously reacted to chlorhexidine. On babies younger than 32 months of age. How to use CHG solution Use CHG only as told by your health care provider, and follow the instructions on  the label. Use the full amount of CHG as directed. Usually, this is one bottle. During a shower Follow these steps when using CHG solution during a shower (unless your health care provider gives you different instructions): Start the shower. Use your normal soap and shampoo to wash your face and hair. Turn off the shower or move out of the shower stream. Pour the CHG onto a clean washcloth. Do not use any type of brush or rough-edged sponge. Starting at your neck, lather your body down to your toes. Make sure you follow these instructions: If you will be having surgery, pay special attention to the part of your body where you will be having surgery. Scrub this area for at least 1 minute. Do not use CHG on your head or face. If the solution gets into your ears or eyes, rinse them well with water. Avoid your genital area. Avoid any areas of skin that have broken skin, cuts, or scrapes. Scrub your back and under your arms. Make sure to wash skin folds. Let the lather sit on your skin for 1-2 minutes or as long as told by your health care provider. Thoroughly rinse your entire body in the shower. Make sure that all body creases and crevices are rinsed well. Dry off with a clean towel. Do not put any substances on your body afterward--such as powder, lotion, or perfume--unless you are told to do so by your health care provider. Only use lotions that are recommended by the manufacturer. Put on clean clothes or pajamas. If it is the night before your surgery,  sleep in clean sheets.  During a sponge bath Follow these steps when using CHG solution during a sponge bath (unless your health care provider gives you different instructions): Use your normal soap and shampoo to wash your face and hair. Pour the CHG onto a clean washcloth. Starting at your neck, lather your body down to your toes. Make sure you follow these instructions: If you will be having surgery, pay special attention to the part of  your body where you will be having surgery. Scrub this area for at least 1 minute. Do not use CHG on your head or face. If the solution gets into your ears or eyes, rinse them well with water. Avoid your genital area. Avoid any areas of skin that have broken skin, cuts, or scrapes. Scrub your back and under your arms. Make sure to wash skin folds. Let the lather sit on your skin for 1-2 minutes or as long as told by your health care provider. Using a different clean, wet washcloth, thoroughly rinse your entire body. Make sure that all body creases and crevices are rinsed well. Dry off with a clean towel. Do not put any substances on your body afterward--such as powder, lotion, or perfume--unless you are told to do so by your health care provider. Only use lotions that are recommended by the manufacturer. Put on clean clothes or pajamas. If it is the night before your surgery, sleep in clean sheets. How to use CHG prepackaged cloths Only use CHG cloths as told by your health care provider, and follow the instructions on the label. Use the CHG cloth on clean, dry skin. Do not use the CHG cloth on your head or face unless your health care provider tells you to. When washing with the CHG cloth: Avoid your genital area. Avoid any areas of skin that have broken skin, cuts, or scrapes. Before surgery Follow these steps when using a CHG cloth to clean before surgery (unless your health care provider gives you different instructions): Using the CHG cloth, vigorously scrub the part of your body where you will be having surgery. Scrub using a back-and-forth motion for 3 minutes. The area on your body should be completely wet with CHG when you are done scrubbing. Do not rinse. Discard the cloth and let the area air-dry. Do not put any substances on the area afterward, such as powder, lotion, or perfume. Put on clean clothes or pajamas. If it is the night before your surgery, sleep in clean sheets.  For  general bathing Follow these steps when using CHG cloths for general bathing (unless your health care provider gives you different instructions). Use a separate CHG cloth for each area of your body. Make sure you wash between any folds of skin and between your fingers and toes. Wash your body in the following order, switching to a new cloth after each step: The front of your neck, shoulders, and chest. Both of your arms, under your arms, and your hands. Your stomach and groin area, avoiding the genitals. Your right leg and foot. Your left leg and foot. The back of your neck, your back, and your buttocks. Do not rinse. Discard the cloth and let the area air-dry. Do not put any substances on your body afterward--such as powder, lotion, or perfume--unless you are told to do so by your health care provider. Only use lotions that are recommended by the manufacturer. Put on clean clothes or pajamas. Contact a health care provider if: Your skin gets irritated  after scrubbing. You have questions about using your solution or cloth. You swallow any chlorhexidine. Call your local poison control center [phone] in the [place] Get help right away if: Your eyes itch badly, or they become very red or swollen. Your skin itches badly and is red or swollen. Your hearing changes. You have trouble seeing. You have swelling or tingling in your mouth or throat. You have trouble breathing. These symptoms may represent a serious problem that is an emergency. Do not wait to see if the symptoms will go away. Get medical help right away. Call your local emergency services (911 in the [place] Do not drive yourself to the hospital. Summary Chlorhexidine gluconate (CHG) is a germ-killing (antiseptic) solution that is used to clean the skin. Cleaning your skin with CHG may help to lower your risk for infection. You may be given CHG to use for bathing. It may be in a bottle or in a prepackaged cloth to use on your  skin. Carefully follow your health care provider's instructions and the instructions on the product label. Do not use CHG if you have a chlorhexidine allergy. Contact your health care provider if your skin gets irritated after scrubbing. This information is not intended to replace advice given to you by your health care provider. Make sure you discuss any questions you have with your health care provider. Document Revised: 09/06/2020 Document Reviewed: 09/06/2020 Elsevier Patient Education  2023 [employer]

## 2022-01-24 ENCOUNTER — Encounter (HOSPITAL_COMMUNITY)
Admission: RE | Admit: 2022-01-24 | Discharge: 2022-01-24 | Disposition: A | Discharge status: Home / Self Care | Payer: 59 | Source: Ambulatory Visit | Attending: General Surgery | Admitting: General Surgery

## 2022-01-24 ENCOUNTER — Encounter (HOSPITAL_COMMUNITY): Payer: Self-pay

## 2022-01-24 VITALS — BP 150/84 | HR 79 | Resp 16 | Ht 68.0 in | Wt 154.0 lb

## 2022-01-24 DIAGNOSIS — Z01810 Encounter for preprocedural cardiovascular examination: Secondary | ICD-10-CM | POA: Insufficient documentation

## 2022-01-24 DIAGNOSIS — I10 Essential (primary) hypertension: Secondary | ICD-10-CM | POA: Diagnosis not present

## 2022-01-24 HISTORY — DX: Other specified postprocedural states: Z98.890

## 2022-01-26 ENCOUNTER — Encounter (HOSPITAL_COMMUNITY)
Admission: RE | Disposition: A | Discharge status: Home / Self Care | Payer: Self-pay | Source: Home / Self Care | Attending: General Surgery

## 2022-01-26 ENCOUNTER — Ambulatory Visit (HOSPITAL_COMMUNITY): Payer: 59 | Admitting: Anesthesiology

## 2022-01-26 ENCOUNTER — Ambulatory Visit (HOSPITAL_BASED_OUTPATIENT_CLINIC_OR_DEPARTMENT_OTHER): Payer: 59 | Admitting: Anesthesiology

## 2022-01-26 ENCOUNTER — Encounter (HOSPITAL_COMMUNITY): Payer: Self-pay | Admitting: General Surgery

## 2022-01-26 ENCOUNTER — Other Ambulatory Visit: Payer: Self-pay

## 2022-01-26 ENCOUNTER — Ambulatory Visit (HOSPITAL_COMMUNITY)
Admission: RE | Admit: 2022-01-26 | Discharge: 2022-01-26 | Disposition: A | Discharge status: Home / Self Care | Payer: 59 | Attending: General Surgery | Admitting: General Surgery

## 2022-01-26 DIAGNOSIS — K760 Fatty (change of) liver, not elsewhere classified: Secondary | ICD-10-CM

## 2022-01-26 DIAGNOSIS — K8010 Calculus of gallbladder with chronic cholecystitis without obstruction: Secondary | ICD-10-CM | POA: Diagnosis not present

## 2022-01-26 DIAGNOSIS — K8020 Calculus of gallbladder without cholecystitis without obstruction: Secondary | ICD-10-CM

## 2022-01-26 DIAGNOSIS — J45909 Unspecified asthma, uncomplicated: Secondary | ICD-10-CM | POA: Diagnosis not present

## 2022-01-26 DIAGNOSIS — K219 Gastro-esophageal reflux disease without esophagitis: Secondary | ICD-10-CM | POA: Diagnosis not present

## 2022-01-26 DIAGNOSIS — L732 Hidradenitis suppurativa: Secondary | ICD-10-CM | POA: Insufficient documentation

## 2022-01-26 DIAGNOSIS — Z79620 Long term (current) use of immunosuppressive biologic: Secondary | ICD-10-CM | POA: Insufficient documentation

## 2022-01-26 DIAGNOSIS — Z79899 Other long term (current) drug therapy: Secondary | ICD-10-CM | POA: Diagnosis not present

## 2022-01-26 DIAGNOSIS — F419 Anxiety disorder, unspecified: Secondary | ICD-10-CM | POA: Diagnosis not present

## 2022-01-26 DIAGNOSIS — I10 Essential (primary) hypertension: Secondary | ICD-10-CM | POA: Insufficient documentation

## 2022-01-26 HISTORY — PX: LIVER BIOPSY: SHX301

## 2022-01-26 HISTORY — PX: CHOLECYSTECTOMY: SHX55

## 2022-01-26 SURGERY — LAPAROSCOPIC CHOLECYSTECTOMY
Anesthesia: General | Site: Abdomen

## 2022-01-26 MED ORDER — DEXAMETHASONE SODIUM PHOSPHATE 10 MG/ML IJ SOLN
INTRAMUSCULAR | Status: AC
  Filled 2022-01-26: qty 1

## 2022-01-26 MED ORDER — SODIUM CHLORIDE 0.9 % IR SOLN
Status: DC | PRN
  Administered 2022-01-26: 1000 mL

## 2022-01-26 MED ORDER — DEXAMETHASONE SODIUM PHOSPHATE 10 MG/ML IJ SOLN
INTRAMUSCULAR | Status: DC | PRN
  Administered 2022-01-26: 10 mg via INTRAVENOUS

## 2022-01-26 MED ORDER — SUGAMMADEX SODIUM 200 MG/2ML IV SOLN
INTRAVENOUS | Status: DC | PRN
  Administered 2022-01-26: 279.6 mg via INTRAVENOUS

## 2022-01-26 MED ORDER — MEPERIDINE HCL 50 MG/ML IJ SOLN: 6.2500 mg | INTRAMUSCULAR | Status: DC | PRN

## 2022-01-26 MED ORDER — BUPIVACAINE LIPOSOME 1.3 % IJ SUSP
INTRAMUSCULAR | Status: AC
  Filled 2022-01-26: qty 20

## 2022-01-26 MED ORDER — LIDOCAINE HCL (PF) 2 % IJ SOLN
INTRAMUSCULAR | Status: AC
  Filled 2022-01-26: qty 5

## 2022-01-26 MED ORDER — DIPHENHYDRAMINE HCL 50 MG/ML IJ SOLN
INTRAMUSCULAR | Status: AC
  Filled 2022-01-26: qty 1

## 2022-01-26 MED ORDER — LACTATED RINGERS IV SOLN: INTRAVENOUS | Status: DC

## 2022-01-26 MED ORDER — ONDANSETRON HCL 4 MG/2ML IJ SOLN: 4.0000 mg | Freq: Once | INTRAMUSCULAR | Status: DC | PRN

## 2022-01-26 MED ORDER — PHENYLEPHRINE HCL (PRESSORS) 10 MG/ML IV SOLN
INTRAVENOUS | Status: DC | PRN
  Administered 2022-01-26 (×2): 80 ug via INTRAVENOUS

## 2022-01-26 MED ORDER — ACETAMINOPHEN 10 MG/ML IV SOLN
INTRAVENOUS | Status: AC
  Filled 2022-01-26: qty 100

## 2022-01-26 MED ORDER — CHLORHEXIDINE GLUCONATE CLOTH 2 % EX PADS: 6.0000 | MEDICATED_PAD | Freq: Once | CUTANEOUS | Status: DC

## 2022-01-26 MED ORDER — MIDAZOLAM HCL 2 MG/2ML IJ SOLN
INTRAMUSCULAR | Status: AC
  Filled 2022-01-26: qty 2

## 2022-01-26 MED ORDER — FENTANYL CITRATE (PF) 250 MCG/5ML IJ SOLN
INTRAMUSCULAR | Status: DC | PRN
  Administered 2022-01-26: 25 ug via INTRAVENOUS
  Administered 2022-01-26: 100 ug via INTRAVENOUS
  Administered 2022-01-26: 25 ug via INTRAVENOUS

## 2022-01-26 MED ORDER — HYDROMORPHONE HCL 1 MG/ML IJ SOLN: 0.2500 mg | INTRAMUSCULAR | Status: DC | PRN

## 2022-01-26 MED ORDER — ACETAMINOPHEN 10 MG/ML IV SOLN
INTRAVENOUS | Status: DC | PRN
  Administered 2022-01-26: 1000 mg via INTRAVENOUS

## 2022-01-26 MED ORDER — EPHEDRINE SULFATE-NACL 50-0.9 MG/10ML-% IV SOSY
PREFILLED_SYRINGE | INTRAVENOUS | Status: DC | PRN
  Administered 2022-01-26: 10 mg via INTRAVENOUS
  Administered 2022-01-26: 5 mg via INTRAVENOUS
  Administered 2022-01-26: 10 mg via INTRAVENOUS

## 2022-01-26 MED ORDER — CHLORHEXIDINE GLUCONATE 0.12 % MT SOLN
15.0000 mL | Freq: Once | OROMUCOSAL | Status: AC
  Administered 2022-01-26: 15 mL via OROMUCOSAL

## 2022-01-26 MED ORDER — MIDAZOLAM HCL 5 MG/5ML IJ SOLN
INTRAMUSCULAR | Status: DC | PRN
  Administered 2022-01-26: 2 mg via INTRAVENOUS

## 2022-01-26 MED ORDER — HEMOSTATIC AGENTS (NO CHARGE) OPTIME
TOPICAL | Status: DC | PRN
  Administered 2022-01-26 (×2): 1

## 2022-01-26 MED ORDER — SODIUM CHLORIDE 0.9 % IV SOLN
2.0000 g | INTRAVENOUS | Status: AC
  Administered 2022-01-26: 2 g via INTRAVENOUS
  Filled 2022-01-26: qty 2

## 2022-01-26 MED ORDER — BUPIVACAINE LIPOSOME 1.3 % IJ SUSP
INTRAMUSCULAR | Status: DC | PRN
  Administered 2022-01-26: 20 mL

## 2022-01-26 MED ORDER — PROPOFOL 10 MG/ML IV BOLUS
INTRAVENOUS | Status: DC | PRN
  Administered 2022-01-26: 150 mg via INTRAVENOUS
  Administered 2022-01-26: 50 mg via INTRAVENOUS

## 2022-01-26 MED ORDER — FENTANYL CITRATE (PF) 250 MCG/5ML IJ SOLN
INTRAMUSCULAR | Status: AC
  Filled 2022-01-26: qty 5

## 2022-01-26 MED ORDER — EPHEDRINE 5 MG/ML INJ
INTRAVENOUS | Status: AC
  Filled 2022-01-26: qty 5

## 2022-01-26 MED ORDER — ROCURONIUM BROMIDE 10 MG/ML (PF) SYRINGE
PREFILLED_SYRINGE | INTRAVENOUS | Status: AC
  Filled 2022-01-26: qty 10

## 2022-01-26 MED ORDER — ONDANSETRON HCL 4 MG/2ML IJ SOLN
INTRAMUSCULAR | Status: AC
  Filled 2022-01-26: qty 2

## 2022-01-26 MED ORDER — SCOPOLAMINE 1 MG/3DAYS TD PT72
1.0000 | MEDICATED_PATCH | Freq: Once | TRANSDERMAL | Status: DC
  Administered 2022-01-26: 1.5 mg via TRANSDERMAL
  Filled 2022-01-26: qty 1

## 2022-01-26 MED ORDER — ORAL CARE MOUTH RINSE: 15.0000 mL | Freq: Once | OROMUCOSAL | Status: AC

## 2022-01-26 MED ORDER — ONDANSETRON HCL 4 MG PO TABS: 4.0000 mg | ORAL_TABLET | Freq: Three times a day (TID) | ORAL | 1 refills | Status: AC | PRN

## 2022-01-26 MED ORDER — PROPOFOL 10 MG/ML IV BOLUS
INTRAVENOUS | Status: AC
  Filled 2022-01-26: qty 20

## 2022-01-26 MED ORDER — PHENYLEPHRINE 80 MCG/ML (10ML) SYRINGE FOR IV PUSH (FOR BLOOD PRESSURE SUPPORT)
PREFILLED_SYRINGE | INTRAVENOUS | Status: AC
  Filled 2022-01-26: qty 10

## 2022-01-26 MED ORDER — DIPHENHYDRAMINE HCL 50 MG/ML IJ SOLN
INTRAMUSCULAR | Status: DC | PRN
  Administered 2022-01-26: 25 mg via INTRAVENOUS

## 2022-01-26 MED ORDER — LIDOCAINE 2% (20 MG/ML) 5 ML SYRINGE
INTRAMUSCULAR | Status: DC | PRN
  Administered 2022-01-26: 50 mg via INTRAVENOUS

## 2022-01-26 MED ORDER — ONDANSETRON HCL 4 MG/2ML IJ SOLN
INTRAMUSCULAR | Status: DC | PRN
  Administered 2022-01-26: 4 mg via INTRAVENOUS

## 2022-01-26 MED ORDER — ROCURONIUM BROMIDE 10 MG/ML (PF) SYRINGE
PREFILLED_SYRINGE | INTRAVENOUS | Status: DC | PRN
  Administered 2022-01-26: 70 mg via INTRAVENOUS

## 2022-01-26 MED ORDER — OXYCODONE HCL 5 MG PO TABS: 5.0000 mg | ORAL_TABLET | ORAL | 0 refills | Status: DC | PRN

## 2022-01-26 MED ORDER — EPINEPHRINE 1 MG/10ML IJ SOSY
PREFILLED_SYRINGE | INTRAMUSCULAR | Status: DC | PRN
  Administered 2022-01-26: .1 mg via INTRAVENOUS
  Administered 2022-01-26: .2 mg via INTRAVENOUS

## 2022-01-26 SURGICAL SUPPLY — 44 items
ADH SKN CLS APL DERMABOND .7 (GAUZE/BANDAGES/DRESSINGS) ×1
APL PRP STRL LF DISP 70% ISPRP (MISCELLANEOUS) ×1
APPLIER CLIP ROT 10 11.4 M/L (STAPLE) ×2
APR CLP MED LRG 11.4X10 (STAPLE) ×1
BLADE SURG 15 STRL LF DISP TIS (BLADE) ×2 IMPLANT
BLADE SURG 15 STRL SS (BLADE) ×2
CHLORAPREP W/TINT 26 (MISCELLANEOUS) ×3 IMPLANT
CLIP APPLIE ROT 10 11.4 M/L (STAPLE) ×2 IMPLANT
CLOTH BEACON ORANGE TIMEOUT ST (SAFETY) ×3 IMPLANT
COVER LIGHT HANDLE STERIS (MISCELLANEOUS) ×6 IMPLANT
DECANTER SPIKE VIAL GLASS SM (MISCELLANEOUS) ×3 IMPLANT
DERMABOND ADVANCED (GAUZE/BANDAGES/DRESSINGS) ×1
DERMABOND ADVANCED .7 DNX12 (GAUZE/BANDAGES/DRESSINGS) ×2 IMPLANT
DRSG TELFA 3X8 NADH STRL (GAUZE/BANDAGES/DRESSINGS) ×1 IMPLANT
ELECT REM PT RETURN 9FT ADLT (ELECTROSURGICAL) ×2
ELECTRODE REM PT RTRN 9FT ADLT (ELECTROSURGICAL) ×2 IMPLANT
GAUZE 4X4 16PLY ~~LOC~~+RFID DBL (SPONGE) IMPLANT
GLOVE BIO SURGEON STRL SZ 6.5 (GLOVE) ×3 IMPLANT
GLOVE BIOGEL PI IND STRL 6.5 (GLOVE) ×2 IMPLANT
GLOVE BIOGEL PI IND STRL 7.0 (GLOVE) ×4 IMPLANT
GLOVE BIOGEL PI INDICATOR 6.5 (GLOVE) ×1
GLOVE BIOGEL PI INDICATOR 7.0 (GLOVE) ×2
GOWN STRL REUS W/TWL LRG LVL3 (GOWN DISPOSABLE) ×9 IMPLANT
HEMOSTAT SNOW SURGICEL 2X4 (HEMOSTASIS) ×4 IMPLANT
INST SET LAPROSCOPIC AP (KITS) ×3 IMPLANT
KIT TURNOVER KIT A (KITS) ×3 IMPLANT
MANIFOLD NEPTUNE II (INSTRUMENTS) ×3 IMPLANT
NDL INSUFFLATION 14GA 120MM (NEEDLE) ×2 IMPLANT
NEEDLE INSUFFLATION 14GA 120MM (NEEDLE) ×2 IMPLANT
NS IRRIG 1000ML POUR BTL (IV SOLUTION) ×3 IMPLANT
PACK LAP CHOLE LZT030E (CUSTOM PROCEDURE TRAY) ×3 IMPLANT
PAD ARMBOARD 7.5X6 YLW CONV (MISCELLANEOUS) ×3 IMPLANT
SET BASIN LINEN APH (SET/KITS/TRAYS/PACK) ×3 IMPLANT
SET TUBE SMOKE EVAC HIGH FLOW (TUBING) ×3 IMPLANT
SLEEVE Z-THREAD 5X100MM (TROCAR) ×3 IMPLANT
SUT MNCRL AB 4-0 PS2 18 (SUTURE) ×6 IMPLANT
SUT VICRYL 0 UR6 27IN ABS (SUTURE) ×3 IMPLANT
SYS BAG RETRIEVAL 10MM (BASKET) ×2
SYSTEM BAG RETRIEVAL 10MM (BASKET) ×2 IMPLANT
TROCAR Z-THRD FIOS HNDL 11X100 (TROCAR) ×3 IMPLANT
TROCAR Z-THREAD FIOS 5X100MM (TROCAR) ×3 IMPLANT
TROCAR Z-THREAD SLEEVE 11X100 (TROCAR) ×3 IMPLANT
TUBE CONNECTING 12X1/4 (SUCTIONS) ×3 IMPLANT
WARMER LAPAROSCOPE (MISCELLANEOUS) ×3 IMPLANT

## 2022-01-26 NOTE — Progress Notes (Signed)
[facility]  Updated family. Rx to [facility]. Will call in a few weeks.  Algis Greenhouse, MD [facility] [address] [place], [place] [id number] [phone] (office)

## 2022-01-26 NOTE — Discharge Instructions (Signed)
Discharge Laparoscopic Surgery Instructions:  Common Complaints: Right shoulder pain is common after laparoscopic surgery. This is secondary to the gas used in the surgery being trapped under the diaphragm.  Walk to help your body absorb the gas. This will improve in a few days. Pain at the port sites are common, especially the larger port sites. This will improve with time.  Some nausea is common and poor appetite. The main goal is to stay hydrated the first few days after surgery.   Diet/ Activity: Diet as tolerated. You may not have an appetite, but it is important to stay hydrated. Drink 64 ounces of water a day. Your appetite will return with time.  Shower per your regular routine daily.  Do not take hot showers. Take warm showers that are less than 10 minutes. Rest and listen to your body, but do not remain in bed all day.  Walk everyday for at least 15-20 minutes. Deep cough and move around every 1-2 hours in the first few days after surgery.  Do not lift > 10 lbs, perform excessive bending, pushing, pulling, squatting for 1-2 weeks after surgery.  Do not pick at the dermabond glue on your incision sites.  This glue film will remain in place for 1-2 weeks and will start to peel off.  Do not place lotions or balms on your incision unless instructed to specifically by Dr. Gennie Eisinger.   Pain Expectations and Narcotics: -After surgery you will have pain associated with your incisions and this is normal. The pain is muscular and nerve pain, and will get better with time. -You are encouraged and expected to take non narcotic medications like tylenol and ibuprofen (when able) to treat pain as multiple modalities can aid with pain treatment. -Narcotics are only used when pain is severe or there is breakthrough pain. -You are not expected to have a pain score of 0 after surgery, as we cannot prevent pain. A pain score of 3-4 that allows you to be functional, move, walk, and tolerate some activity is  the goal. The pain will continue to improve over the days after surgery and is dependent on your surgery. -Due to [place] law, we are only able to give a certain amount of pain medication to treat post operative pain, and we only give additional narcotics on a patient by patient basis.  -For most laparoscopic surgery, studies have shown that the majority of patients only need 10-15 narcotic pills, and for open surgeries most patients only need 15-20.   -Having appropriate expectations of pain and knowledge of pain management with non narcotics is important as we do not want anyone to become addicted to narcotic pain medication.  -Using ice packs in the first 48 hours and heating pads after 48 hours, wearing an abdominal binder (when recommended), and using over the counter medications are all ways to help with pain management.   -Simple acts like meditation and mindfulness practices after surgery can also help with pain control and research has proven the benefit of these practices.  Medication: Take tylenol and ibuprofen as needed for pain control, alternating every 4-6 hours.  Example:  Tylenol 1000mg @ 6am, 12noon, 6pm, 12midnight (Do not exceed 4000mg of tylenol a day). Ibuprofen 800mg @ 9am, 3pm, 9pm, 3am (Do not exceed 3600mg of ibuprofen a day).  Take Roxicodone for breakthrough pain every 4 hours.  Take Colace for constipation related to narcotic pain medication. If you do not have a bowel movement in 2 days, take Miralax   over the counter.  Drink plenty of water to also prevent constipation.   Contact Information: If you have questions or concerns, please call our office, [phone], Monday- Thursday 8AM-5PM and Friday 8AM-12Noon.  If it is after hours or on the weekend, please call [facility], [phone], [phone], and ask to speak to the surgeon on call for Dr. Jamille Yoshino at [place].   

## 2022-01-26 NOTE — Op Note (Signed)
Operative Note   Preoperative Diagnosis: Symptomatic cholelithiasis, fatty liver disease    Postoperative Diagnosis: Same   Procedure(s) Performed: Laparoscopic cholecystectomy and liver biopsy   Surgeon: Leatrice Jewels. Henreitta Leber, MD   Assistants: No qualified resident was available   Anesthesia: General endotracheal   Anesthesiologist: Molli Barrows, MD    Specimens: Gallbladder    Estimated Blood Loss: 50cc     Blood Replacement: None    Complications: None    Operative Findings: distended gallbladder with stones   Procedure: The patient was taken to the operating room and placed supine. General endotracheal anesthesia was induced. Intravenous antibiotics were administered per protocol. An orogastric tube positioned to decompress the stomach. The abdomen was prepared and draped in the usual sterile fashion.    A supraumbilical incision was made and a Veress technique was utilized to achieve pneumoperitoneum to 15 mmHg with carbon dioxide. A 11 mm optiview port was placed through the supraumbilical region, and a 10 mm 0-degree operative laparoscope was introduced. The area underlying the trocar and Veress needle were inspected and without evidence of injury.  Remaining trocars were placed under direct vision. Two 5 mm ports were placed in the right abdomen, between the anterior axillary and midclavicular line.  A final 11 mm port was placed through the mid-epigastrium, near the falciform ligament.    The gallbladder fundus was elevated cephalad and the infundibulum was retracted to the patient's right. The gallbladder/cystic duct junction was skeletonized. The cystic artery noted in the triangle of Calot and was also skeletonized.  We then continued liberal medial and lateral dissection until the critical view of safety was achieved.    The cystic duct was triply clipped and cystic artery was doubly clipped and both were divided. The gallbladder was then dissected from the liver bed  with electrocautery.  A wedge liver biopsy was taken with cautery. The specimens were placed in an Endopouch and was retrieved through the epigastric site.   Final inspection revealed acceptable hemostasis. Surgical SNOW was placed in the gallbladder bed.  Trocars were removed and pneumoperitoneum was released.  0 Vicryl fascial sutures were used to close the epigastric and umbilical port sites. Skin incisions were closed with 4-0 Monocryl subcuticular sutures and Dermabond. The patient was awakened from anesthesia and extubated without complication.    Algis Greenhouse, MD [facility] [address] [place], [place] [address] [phone] (office)

## 2022-01-26 NOTE — Anesthesia Procedure Notes (Signed)

## 2022-01-26 NOTE — Transfer of Care (Signed)
Immediate Anesthesia Transfer of Care Note  Patient: Patty White  Procedure(s) Performed: LAPAROSCOPIC CHOLECYSTECTOMY (Abdomen) LIVER BIOPSY (Abdomen)  Patient Location: PACU  Anesthesia Type:General  Level of Consciousness: awake, alert  and oriented  Airway & Oxygen Therapy: Patient Spontanous Breathing  Post-op Assessment: Report given to RN, Post -op Vital signs reviewed and stable, Patient moving all extremities X 4 and Patient able to stick tongue midline  Post vital signs: Reviewed  Last Vitals:  Vitals Value Taken Time  BP 132/76   Temp 98.4   Pulse 87 01/26/22 1013  Resp 17 01/26/22 1013  SpO2 99 % 01/26/22 1013  Vitals shown include unvalidated device data.  Last Pain:  Vitals:   01/26/22 0755  TempSrc: Oral  PainSc: 0-No pain      Patients Stated Pain Goal: 5 (01/26/22 0755)  Complications: No notable events documented.

## 2022-01-26 NOTE — Interval H&P Note (Signed)
History and Physical Interval Note:  01/26/2022 8:27 AM  Patty White  has presented today for surgery, with the diagnosis of CHOLELITHIASIS FATTY LIVER.  The various methods of treatment have been discussed with the patient and family. After consideration of risks, benefits and other options for treatment, the patient has consented to  Procedure(s): LAPAROSCOPIC CHOLECYSTECTOMY (N/A) LIVER BIOPSY (N/A) as a surgical intervention.  The patient's history has been reviewed, patient examined, no change in status, stable for surgery.  I have reviewed the patient's chart and labs.  Questions were answered to the patient's satisfaction.     Lucretia Roers

## 2022-01-26 NOTE — Anesthesia Preprocedure Evaluation (Signed)
Anesthesia Evaluation  Patient identified by MRN, date of birth, ID band Patient awake    Reviewed: Allergy & Precautions, NPO status , Patient's Chart, lab work & pertinent test results  History of Anesthesia Complications (+) PONV and history of anesthetic complications  Airway Mallampati: II  TM Distance: >3 FB Neck ROM: Full    Dental  (+) Dental Advisory Given, Caps   Pulmonary asthma ,    Pulmonary exam normal breath sounds clear to auscultation       Cardiovascular Exercise Tolerance: Good hypertension, Pt. on medications Normal cardiovascular exam Rhythm:Regular Rate:Normal     Neuro/Psych PSYCHIATRIC DISORDERS Anxiety negative neurological ROS     GI/Hepatic Neg liver ROS, GERD  Medicated and Controlled,  Endo/Other  negative endocrine ROS  Renal/GU negative Renal ROS  negative genitourinary   Musculoskeletal negative musculoskeletal ROS (+)   Abdominal   Peds negative pediatric ROS (+)  Hematology negative hematology ROS (+)   Anesthesia Other Findings   Reproductive/Obstetrics negative OB ROS                            Anesthesia Physical Anesthesia Plan  ASA: 2  Anesthesia Plan: General   Post-op Pain Management: Dilaudid IV   Induction: Intravenous  PONV Risk Score and Plan: 4 or greater and Ondansetron, Dexamethasone, Midazolam and Scopolamine patch - Pre-op  Airway Management Planned: Oral ETT  Additional Equipment:   Intra-op Plan:   Post-operative Plan:   Informed Consent: I have reviewed the patients History and Physical, chart, labs and discussed the procedure including the risks, benefits and alternatives for the proposed anesthesia with the patient or authorized representative who has indicated his/her understanding and acceptance.     Dental advisory given  Plan Discussed with: CRNA and Surgeon  Anesthesia Plan Comments:          Anesthesia Quick Evaluation

## 2022-01-26 NOTE — Anesthesia Postprocedure Evaluation (Signed)
Anesthesia Post Note  Patient: Patty White  Procedure(s) Performed: LAPAROSCOPIC CHOLECYSTECTOMY (Abdomen) LIVER BIOPSY (Abdomen)  Patient location during evaluation: Phase II Anesthesia Type: General Level of consciousness: awake and alert and oriented Pain management: pain level controlled Vital Signs Assessment: post-procedure vital signs reviewed and stable Respiratory status: spontaneous breathing, nonlabored ventilation and respiratory function stable Cardiovascular status: blood pressure returned to baseline and stable Postop Assessment: no apparent nausea or vomiting Anesthetic complications: no   No notable events documented.   Last Vitals:  Vitals:   01/26/22 1045 01/26/22 1058  BP: 137/87 115/82  Pulse: 90 78  Resp: 19 18  Temp:  36.6 C  SpO2: 100% 100%    Last Pain:  Vitals:   01/26/22 1058  TempSrc: Oral  PainSc: 5                  Zerrick Hanssen C Tamma Brigandi

## 2022-01-27 LAB — SURGICAL PATHOLOGY

## 2022-01-30 ENCOUNTER — Encounter (HOSPITAL_COMMUNITY): Payer: Self-pay | Admitting: General Surgery

## 2022-02-06 ENCOUNTER — Ambulatory Visit (INDEPENDENT_AMBULATORY_CARE_PROVIDER_SITE_OTHER): Payer: 59 | Admitting: General Surgery

## 2022-02-06 DIAGNOSIS — K760 Fatty (change of) liver, not elsewhere classified: Secondary | ICD-10-CM

## 2022-02-06 DIAGNOSIS — K8020 Calculus of gallbladder without cholecystitis without obstruction: Secondary | ICD-10-CM

## 2022-02-06 NOTE — Progress Notes (Signed)
[facility]  I am calling the patient for post operative evaluation. This is not a billable encounter as it is under the global charges for the surgery.  The patient had a laparoscopic cholecystectomy on 7/20. The patient reports that she is doing well but she has some reflux still and I told her that she will probably end up and need an EGD after all.  The are tolerating a diet, having good pain control, and having regular Bms.  The incisions are healing. The patient has no concerns.   Pathology: A.   GALLBLADDER, CHOLECYSTECTOMY:  -    Chronic cholecystitis, calculous.  -    No malignancy identified.   B.   LIVER WEDGE:  -    Liver tissue with no specific pathologic diagnosis  -    Negative for steatosis.  -    Negative for inflammation and fibrosis.  -    No stainable iron detected.   Will see the patient PRN.   Algis Greenhouse, MD [facility] [address] [place], [place] [address] [phone] (office)

## 2022-02-20 ENCOUNTER — Ambulatory Visit (HOSPITAL_COMMUNITY)
Admission: RE | Admit: 2022-02-20 | Discharge: 2022-02-20 | Disposition: A | Discharge status: Home / Self Care | Payer: 59 | Source: Ambulatory Visit | Attending: Family Medicine | Admitting: Family Medicine

## 2022-02-20 DIAGNOSIS — Z1231 Encounter for screening mammogram for malignant neoplasm of breast: Secondary | ICD-10-CM | POA: Insufficient documentation

## 2022-03-25 NOTE — H&P (View-Only) (Signed)
Referring Provider: Leone Payor, FNP Primary Care Physician:  Leone Payor, FNP Primary GI Physician: Dr. Marletta Lor  Chief Complaint  Patient presents with   Follow-up    Stomach always hurts. Still has acid reflux and still take 3 omeprazole.     HPI:   Patty White is a 56 y.o. female presenting today for follow-up.  Last seen in our office 12/08/2021 at the time of initial consult.  She reported chronic GERD which was uncontrolled with associated intermittent vomiting, reported PUD 15 years ago, several years of solid food dysphagia.  Reported failing Aciphex and was taking 3 omeprazole daily.  Also taking over-the-counter Zegerid.  Also with intermittent mid right abdominal pain when more active, occasionally triggered by meals, specifically greasy/fatty meals or tomato based products.  Unable to eat large meals as she felt bloated and had dull pain.  She was taking naproxen and ibuprofen daily for kyphoscoliosis.  We also discussed elevated liver enzymes  (alkaline phosphatase 418, AST 73, ALT 97 in February 2023) which patient reported has been present for a while.  Reported drinking 2 glasses of wine daily and that she would never stop.  Also drinking herbal tea every day.  No illicit drug use, Tylenol use, personal or family history of autoimmune conditions or family history of liver disease.  Had recent RUQ ultrasound with cholelithiasis, hepatic steatosis.  Plan included stopping omeprazole, start Dexilant, arrange EGD, NSAID avoidance, herbal tea and alcohol avoidance, update labs and screen for hepatitis, hemochromatosis, AIH, PBC.  Also discussed scheduling first-ever colonoscopy, but patient declined and stated she would complete Cologuard which she already had at home.  Labs completed 12/08/2021: Alk phos 187, AST 29, ALT 28.  ANA positive with 1: 320 titer, nuclear dense fine speckled pattern which can be seen in normal individuals, rarely with SLE, Sjogren's, or systemic  sclerosis.  ASMA elevated at 71.  AMA negative.  Immunoglobulins within normal limits.  Iron panel within normal limits.  No immunity to hepatitis A.  Hep B negative without immunity.  Hep C negative.   Case reviewed with Dr. Marletta Lor who recommended rechecking labs in 3 months as LFTs were improving and if still elevated, consider additional imaging with MRI and possibly a liver biopsy.  EGD was canceled as patient did not follow instructions and patient also stated she did not need to procedure anyways as she felt her problem was related to her gallbladder and that she has no longer having dysphagia.  She was referred to general surgery.  She underwent cholecystectomy 01/26/2022. She also had liver biopsy at the same time which showed no specific pathologic diagnosis, negative for steatosis, inflammation and fibrosis, and no stainable iron detected.   Today:  Very difficult to keep patient on track today to discuss her GI problems. She is frequently talking about her chronic pain and how no doctors will prescribe pain medications.   GERD: Still taking 3+ omeprazole daily and continues with frequent breakthrough symptoms.  Dexilant was too expensive.  Some nausea and intermittent vomiting in the morning.   NSAIDs: Naproxen (4)/ibuprofen (6) daily, and occasional Goody powders for kyphoscoliosis, Headaches.   Dysphagia: Continues with sensation of solid foods getting hung in her mid to lower esophagus.   Abdominal pain: Upper abdominal pain resolved. Having mid to lower abdominal pain. Started years ago. Daily. Comes and goes. No identified trigger. States she isn't worried about it right now. Not changed by activity. Not affected by bowel movements or meals. Bowels  are moving most every day. No straining. Doesn't look at her stool. Doesn't know if she has brbpr or melena. Again declining colonosocpy and states she can't complete the cologuard as she can't collect her poop. No abnormal vaginal  bleeding. LMP was at 26. Had a bad pap smear recently and has to follow-up on this. Concerned that she has cancer. Some difficulty urinating. Has to strain to urinate. This is chronic. Has declined urology evaluation in the past.     2 glasses of wine nightly at least. States she drinks to get "drink" on the weekend. Usually drinking a "Giant jug" of wine on the weekend.  No regular tylenol.     Past Medical History:  Diagnosis Date   Anxiety    Asthma    GERD (gastroesophageal reflux disease)    Hidradenitis suppurativa    HTN (hypertension)    PONV (postoperative nausea and vomiting)    Scoliosis     Past Surgical History:  Procedure Laterality Date   arm surgery     CESAREAN SECTION     CHOLECYSTECTOMY N/A 01/26/2022   Procedure: LAPAROSCOPIC CHOLECYSTECTOMY;  Surgeon: Virl Cagey, MD;  Location: [facility];  Service: General;  Laterality: N/A;   FRACTURE SURGERY     LIVER BIOPSY N/A 01/26/2022   Procedure: LIVER BIOPSY;  Surgeon: Virl Cagey, MD; no specific pathologic diagnosis, negative for steatosis, inflammation and fibrosis, and no stainable iron detected.    Current Outpatient Medications  Medication Sig Dispense Refill   acetaminophen (TYLENOL) 325 MG tablet Take 650 mg by mouth every 6 (six) hours as needed for moderate pain.     Adalimumab (HUMIRA PEDIATRIC CROHNS START) 80 MG/0.8ML & $RemoveBe'40MG'yFVRvRWRA$ /0.4ML PSKT Inject 80 mg into the skin every 14 (fourteen) days.     albuterol (PROVENTIL HFA;VENTOLIN HFA) 108 (90 BASE) MCG/ACT inhaler 1-2 puffs q 4-6 hrs 1 Inhaler 0   b complex vitamins capsule Take 1 capsule by mouth daily.     Brimonidine Tartrate (LUMIFY) 0.025 % SOLN Place 1 drop into both eyes daily as needed (irritation).     busPIRone (BUSPAR) 30 MG tablet Take 30 mg by mouth 2 (two) times daily.     cetirizine (ZYRTEC) 10 MG tablet Take 10 mg by mouth daily.     clonazePAM (KLONOPIN) 1 MG tablet Take 1 mg by mouth at bedtime as needed (sleep).      diphenhydrAMINE (BENADRYL) 25 MG tablet Take 25 mg by mouth at bedtime as needed for allergies.     DULoxetine (CYMBALTA) 60 MG capsule Take 60 mg by mouth daily.     ibuprofen (ADVIL,MOTRIN) 200 MG tablet Take 400 mg by mouth every 6 (six) hours as needed for mild pain, headache or moderate pain.     naproxen (NAPROSYN) 500 MG tablet Take 1 tablet (500 mg total) by mouth 2 (two) times daily with a meal. 20 tablet 0   olmesartan (BENICAR) 40 MG tablet Take 40 mg by mouth daily.     omeprazole-sodium bicarbonate (ZEGERID) 40-1100 MG capsule Take 1 capsule by mouth 3 (three) times daily as needed (acid reflux).     oxymetazoline (AFRIN) 0.05 % nasal spray Place 1 spray into both nostrils 2 (two) times daily.     pantoprazole (PROTONIX) 40 MG tablet Take 1 tablet (40 mg total) by mouth 2 (two) times daily before a meal. 60 tablet 3   triamcinolone cream (KENALOG) 0.1 % Apply 1 application  topically 2 (two) times a week.  ondansetron (ZOFRAN) 4 MG tablet Take 1 tablet (4 mg total) by mouth every 8 (eight) hours as needed. (Patient not taking: Reported on 03/27/2022) 30 tablet 1   No current facility-administered medications for this visit.    Allergies as of 03/27/2022 - Review Complete 03/27/2022  Allergen Reaction Noted   Iodine Anaphylaxis 03/31/2011   Other  03/31/2011   Cefotetan Other (See Comments) 01/26/2022    Family History  Problem Relation Age of Onset   Cancer Other    Colon cancer Neg Hx     Social History   Socioeconomic History   Marital status: Married    Spouse name: Not on file   Number of children: Not on file   Years of education: Not on file   Highest education level: Not on file  Occupational History   Not on file  Tobacco Use   Smoking status: Never   Smokeless tobacco: Never  Substance and Sexual Activity   Alcohol use: Yes    Comment: 2 glasses of red wine nightly. States she "drinks to get drunk" on the weekend. Usually 1 "jug" of wine per weekend.    Drug use: No   Sexual activity: Yes    Birth control/protection: None  Other Topics Concern   Not on file  Social History Narrative   Not on file   Social Determinants of Health   Financial Resource Strain: Not on file  Food Insecurity: Not on file  Transportation Needs: Not on file  Physical Activity: Not on file  Stress: Not on file  Social Connections: Not on file    Review of Systems: Gen: Denies fever, chills, cold or flulike symptoms, presyncope, syncope. CV: Denies chest pain, palpitations. Resp: Denies dyspnea, cough.  GI: See HPI Heme: See HPI  Physical Exam: BP 139/80 (BP Location: Left Arm, Patient Position: Sitting, Cuff Size: Normal)   Pulse 87   Temp 97.9 F (36.6 C) (Temporal)   Ht $R'5\' 8"'uS$  (1.727 m)   Wt 156 lb 9.6 oz (71 kg)   LMP 05/31/2014   SpO2 98%   BMI 23.81 kg/m  General:   Alert and oriented. No distress noted. Pleasant and cooperative.  Head:  Normocephalic and atraumatic. Eyes:  Conjuctiva clear without scleral icterus. Heart:  S1, S2 present without murmurs appreciated. Lungs:  Clear to auscultation bilaterally. No wheezes, rales, or rhonchi. No distress.  Abdomen:  +BS, soft, non-tender and non-distended. No rebound or guarding. No HSM or masses noted. Msk:  Symmetrical without gross deformities. Normal posture. Extremities:  Without edema. Neurologic:  Alert and  oriented x4 Psych:  Normal mood and affect.    Assessment:  56 year old female with history of HTN, anxiety, asthma, GERD, hidradenitis suppurativa, scoliosis, presenting today for follow-up of GERD, dysphagia, abdominal pain, elevated LFTs.  GERD:  Chronic.  Uncontrolled on omeprazole 40 mg 3 times daily and Zegrid prn.  Likely influenced by diet, alcohol, and chronic NSAID use.  Previously prescribed Dexilant, but this was too expensive.  She reports she previously failed Aciphex.  Looks like pantoprazole is covered by insurance, so we will try this.  We will also plan for  EGD in the near future.  Dysphagia:  Several year history of solid food dysphagia with food getting hung in the mid to lower esophagus.  This is in the setting of uncontrolled GERD.  She may have esophageal web, ring, stricture related to reflux, motility disorder, and unable to rule out malignancy but this is less likely. Needs EGD  for further evaluation. This was previously scheduled, but cancelled as she didn't remain NPO. She is agreeable to following instructions as long as she has an early morning appt.   Upper abdominal pain:  Resolved following cholecystectomy.  Mid/lower abdominal pain:  Chronic intermittent mid to lower abdominal pain occurring on a daily basis without identified aggravating or relieving factors.  Denies any association with certain movements, bowel movements, or meals. Could have inflammation within her GI tract related to chronic, significant NSAID use.  Patient states she does not look at her stool so does not know if she has BRBPR or melena.  She is never had a colonoscopy and has declined this as well as Cologuard.  No vaginal bleeding.  She does have some difficulty urinating requiring straining which she reports is chronic and has also declined urologic evaluation.  Also reports recent "bad pap". Her abdominal exam is benign today. We will hold off on further evaluation as patient is not interested.   Elevated LFTs:  Likely multifactorial in setting of chronic alcohol use and biliary colic. February 2023, alkaline phosphatase 418, AST 73, ALT 97, total bilirubin 0.5. RUQ ultrasound in March 2023 with cholelithiasis without acute cholecystitis, 2 cm gallstone in gallbladder neck, hepatic steatosis.  Additional evaluation in June with  ANA positive with 1: 320 titer, nuclear dense fine speckled pattern which can be seen in normal individuals, rarely with SLE, Sjogren's, or systemic sclerosis.  ASMA elevated at 71.  AMA negative.  Immunoglobulins within normal limits.  Iron  panel within normal limits.  No immunity to hepatitis A.  Hep B negative without immunity.  Hep C negative. She underwent cholecystectomy 01/26/2022 and also had liver biopsy performed at the same time which showed no specific pathologic diagnosis, negative for steatosis, inflammation and fibrosis, and no stainable iron detected.  Most recent HFP prior to cholecystectomy on 12/08/2021 with alk phos 97, AST 29, ALT 28. We will repeat HFP today for follow-up.   She was counseled today on the importance of limiting her alcohol consumption as she is drinking at least 2 glasses of wine nightly and states she "drinks to get drunk" on the weekends.  Autoantibody positivity may be related to Hidradenitis suppurativa and/or secondary to use of anti-TNF. Unable to rule out other rheumatologic conditions. Doesn't seems to be coming from liver etiology.    Plan:  HFP Proceed with upper endoscopy +/- dilation with propofol by Dr. Abbey Chatters in near future. The risks, benefits, and alternatives have been discussed with the patient in detail. The patient states understanding and desires to proceed.  ASA 2 Stop omeprazole and Zegerid. Start pantoprazole 40 mg twice daily 30 minutes before breakfast and dinner. Use Pepcid 20 mg twice daily for breakthrough as needed.  May also use Tums as needed. Counseled on GERD diet/lifestyle.  Separate written instructions provided.  See AVS. Counseled on the importance of discontinuing NSAIDs and discussing her chronic pain with PCP or pain management. Limit alcohol as much as possible.  At max, recommended no more than 1 drink per day, but ideally less than this.  Low fat/cholesterol diet.   Avoid sweets, sodas, fruit juices, sweetened beverages like tea, etc. Gradually increase exercise from 15 min daily up to 1 hr per day 5 days/week. Follow-up in 3 months or sooner if needed.    Aliene Altes, PA-C [facility] 03/27/2022

## 2022-03-25 NOTE — Progress Notes (Unsigned)
Referring Provider: Valentino Nose, FNP Primary Care Physician:  Valentino Nose, FNP Primary GI Physician: Dr. Abbey Chatters  No chief complaint on file.   HPI:   Patty White is a 56 y.o. female presenting today for follow-up.  Last seen in our office 12/08/2021 at the time of initial consult.  She reported chronic GERD which was uncontrolled with associated intermittent vomiting, PUD 15 years ago, several years of solid food dysphagia.  Reported she failed Aciphex and was taking 3 omeprazole daily.  Also taking over-the-counter Zegerid.  Also with intermittent mid right abdominal pain when more active, occasionally triggered by meals, specifically greasy/fatty meals or tomato based products.  Unable to eat large meals as she felt bloated and had dull pain.  She is taking naproxen and ibuprofen daily for kyphoscoliosis.  We also discussed elevated liver enzymes  (alkaline phosphatase 418, AST 73, ALT 97 in February 2023) which patient reported has been present for a while.  Reported drinking 2 glasses of wine daily and that she would never stop.  Also drinking herbal tea every day.  No illicit drug use, Tylenol use, personal or family history of autoimmune conditions or family history of liver disease.  Had recent RUQ ultrasound with cholelithiasis, hepatic steatosis.  Plan included stopping omeprazole, start Dexilant, arrange EGD, NSAID avoidance, herbal tea and alcohol avoidance, update labs and screen for hepatitis, hemochromatosis, AIH, PBC.  Also discussed scheduling first-ever colonoscopy, but patient declined and stated she would complete Cologuard which she already had at home.  Labs completed 12/08/2021: Alk phos 187, AST 29, ALT 28.  ANA positive with 1: 320 titer, nuclear dense fine speckled pattern which can be seen in normal individuals, rarely with SLE, Sjogren's, or systemic sclerosis.  ASMA elevated at 71.  AMA negative.  Immunoglobulins within normal limits.  Iron panel within normal  limits.  No immunity to hepatitis A.  Hep B negative without immunity.  Hep C negative.   Case reviewed with Dr. Abbey Chatters who recommended rechecking labs in 3 months as LFTs were improving and if still elevated, consider additional imaging with MRI and possibly a liver biopsy.  EGD was canceled as patient did not follow instructions and patient also stated she did not need to procedure anyways as she felt her problem was related to her gallbladder and that she has no longer having dysphagia.  She was referred to general surgery.  She underwent cholecystectomy 01/26/2022. She also had liver biopsy at the same time which showed no specific pathologic diagnosis, negative for steatosis, inflammation and fibrosis, and no stainable iron detected.   Today:   GERD:  Dysphagia:  Abdominal pain:  Elevated LFTs:  Past Medical History:  Diagnosis Date   Anxiety    Asthma    GERD (gastroesophageal reflux disease)    Hidradenitis suppurativa    HTN (hypertension)    PONV (postoperative nausea and vomiting)    Scoliosis     Past Surgical History:  Procedure Laterality Date   arm surgery     CESAREAN SECTION     CHOLECYSTECTOMY N/A 01/26/2022   Procedure: LAPAROSCOPIC CHOLECYSTECTOMY;  Surgeon: Virl Cagey, MD;  Location: [facility];  Service: General;  Laterality: N/A;   FRACTURE SURGERY     LIVER BIOPSY N/A 01/26/2022   Procedure: LIVER BIOPSY;  Surgeon: Virl Cagey, MD;  Location: [facility];  Service: General;  Laterality: N/A;    Current Outpatient Medications  Medication Sig Dispense Refill   acetaminophen (TYLENOL) 325 MG tablet  Take 650 mg by mouth every 6 (six) hours as needed for moderate pain.     Adalimumab (HUMIRA PEDIATRIC CROHNS START) 80 MG/0.8ML & 40MG/0.4ML PSKT Inject 80 mg into the skin every 14 (fourteen) days.     albuterol (PROVENTIL HFA;VENTOLIN HFA) 108 (90 BASE) MCG/ACT inhaler 1-2 puffs q 4-6 hrs 1 Inhaler 0   b complex vitamins capsule Take 1 capsule by  mouth daily.     Brimonidine Tartrate (LUMIFY) 0.025 % SOLN Place 1 drop into both eyes daily as needed (irritation).     busPIRone (BUSPAR) 30 MG tablet Take 30 mg by mouth 2 (two) times daily.     cetirizine (ZYRTEC) 10 MG tablet Take 10 mg by mouth daily.     clonazePAM (KLONOPIN) 1 MG tablet Take 1 mg by mouth at bedtime as needed (sleep).     diphenhydrAMINE (BENADRYL) 25 MG tablet Take 25 mg by mouth at bedtime as needed for allergies.     DULoxetine (CYMBALTA) 60 MG capsule Take 60 mg by mouth daily.     ibuprofen (ADVIL,MOTRIN) 200 MG tablet Take 400 mg by mouth every 6 (six) hours as needed for mild pain, headache or moderate pain.     naproxen (NAPROSYN) 500 MG tablet Take 1 tablet (500 mg total) by mouth 2 (two) times daily with a meal. (Patient not taking: Reported on 01/18/2022) 20 tablet 0   olmesartan (BENICAR) 40 MG tablet Take 40 mg by mouth daily.     omeprazole (PRILOSEC) 40 MG capsule Take 40 mg by mouth 3 (three) times daily.     omeprazole-sodium bicarbonate (ZEGERID) 40-1100 MG capsule Take 1 capsule by mouth 3 (three) times daily as needed (acid reflux).     ondansetron (ZOFRAN) 4 MG tablet Take 1 tablet (4 mg total) by mouth every 8 (eight) hours as needed. 30 tablet 1   oxyCODONE (ROXICODONE) 5 MG immediate release tablet Take 1 tablet (5 mg total) by mouth every 4 (four) hours as needed for breakthrough pain or severe pain. 15 tablet 0   oxymetazoline (AFRIN) 0.05 % nasal spray Place 1 spray into both nostrils 2 (two) times daily.     triamcinolone cream (KENALOG) 0.1 % Apply 1 application  topically 2 (two) times a week.     No current facility-administered medications for this visit.    Allergies as of 03/27/2022 - Review Complete 01/26/2022  Allergen Reaction Noted   Iodine Anaphylaxis 03/31/2011   Other  03/31/2011   Cefotetan Other (See Comments) 01/26/2022    Family History  Problem Relation Age of Onset   Cancer Other    Colon cancer Neg Hx     Social  History   Socioeconomic History   Marital status: Married    Spouse name: Not on file   Number of children: Not on file   Years of education: Not on file   Highest education level: Not on file  Occupational History   Not on file  Tobacco Use   Smoking status: Never   Smokeless tobacco: Never  Substance and Sexual Activity   Alcohol use: Yes    Comment: 2 glasses of red wine nightly.   Drug use: No   Sexual activity: Yes    Birth control/protection: None  Other Topics Concern   Not on file  Social History Narrative   Not on file   Social Determinants of Health   Financial Resource Strain: Not on file  Food Insecurity: Not on file  Transportation Needs: Not  on file  Physical Activity: Not on file  Stress: Not on file  Social Connections: Not on file    Review of Systems: Gen: Denies fever, chills, cold or flulike symptoms, presyncope, syncope. CV: Denies chest pain, palpitations. Resp: Denies dyspnea, cough.  GI: See HPI Heme: See HPI  Physical Exam: LMP 05/31/2014  General:   Alert and oriented. No distress noted. Pleasant and cooperative.  Head:  Normocephalic and atraumatic. Eyes:  Conjuctiva clear without scleral icterus. Heart:  S1, S2 present without murmurs appreciated. Lungs:  Clear to auscultation bilaterally. No wheezes, rales, or rhonchi. No distress.  Abdomen:  +BS, soft, non-tender and non-distended. No rebound or guarding. No HSM or masses noted. Msk:  Symmetrical without gross deformities. Normal posture. Extremities:  Without edema. Neurologic:  Alert and  oriented x4 Psych:  Normal mood and affect.    Assessment:     Plan:  ***   Aliene Altes, PA-C [facility] 03/27/2022

## 2022-03-27 ENCOUNTER — Ambulatory Visit: Payer: 59 | Admitting: Gastroenterology

## 2022-03-27 ENCOUNTER — Telehealth: Payer: Self-pay | Admitting: *Deleted

## 2022-03-27 ENCOUNTER — Encounter: Payer: Self-pay | Admitting: Gastroenterology

## 2022-03-27 ENCOUNTER — Encounter: Payer: Self-pay | Admitting: *Deleted

## 2022-03-27 VITALS — BP 139/80 | HR 87 | Temp 97.9°F | Ht 68.0 in | Wt 156.6 lb

## 2022-03-27 DIAGNOSIS — R7989 Other specified abnormal findings of blood chemistry: Secondary | ICD-10-CM

## 2022-03-27 DIAGNOSIS — R1011 Right upper quadrant pain: Secondary | ICD-10-CM

## 2022-03-27 DIAGNOSIS — R1310 Dysphagia, unspecified: Secondary | ICD-10-CM | POA: Diagnosis not present

## 2022-03-27 DIAGNOSIS — R109 Unspecified abdominal pain: Secondary | ICD-10-CM

## 2022-03-27 DIAGNOSIS — K219 Gastro-esophageal reflux disease without esophagitis: Secondary | ICD-10-CM | POA: Diagnosis not present

## 2022-03-27 MED ORDER — PANTOPRAZOLE SODIUM 40 MG PO TBEC: 40.0000 mg | DELAYED_RELEASE_TABLET | Freq: Two times a day (BID) | ORAL | 3 refills | Status: DC

## 2022-03-27 NOTE — Telephone Encounter (Signed)
Per [employer]: "Notification or Prior Authorization is not required for the requested services  This [employer] plan does not currently require a prior authorization for these services. If you have general questions about the prior authorization requirements, please call us at [phone] or visit [email or web address] > Clinician Resources > Advance and Admission Notification Requirements. The number above acknowledges your notification. Please write this number down for future reference. Notification is not a guarantee of coverage or payment.  Decision ID #:[id number]"

## 2022-03-27 NOTE — Patient Instructions (Addendum)
Please have blood work completed at [facility]   We will arrange for you to have an upper endoscopy with possible stretching of your esophagus with Dr. Abbey Chatters in the near future.   Stop omeprazole and Zegrid.  Start Pantoprazole 40 mg daily 30 minutes before breakfast and dinner.   You may use Pepcid 20 mg twice daily as needed for breakthrough heartburn or Tums.  Follow a GERD diet:  Avoid fried, fatty, greasy, spicy, citrus foods. Avoid caffeine and carbonated beverages. Avoid chocolate. Try eating 4-6 small meals a day rather than 3 large meals. Do not eat within 3 hours of laying down. Prop head of bed up on wood or bricks to create a 6 inch incline.  Stop ibuprofen or naproxen, Goody powders, and talk with your pain doctor or primary care doctor about other options for you.  Limit alcohol use as much as possible.  In general, is recommended that you do not have more than 1 drink per day.  However, this may be contributing to your reflux symptoms and elevated liver enzymes.   Low fat/cholesterol diet.   Avoid sweets, sodas, fruit juices, sweetened beverages like tea, etc. Gradually increase exercise from 15 min daily up to 1 hr per day 5 days/week.  We will plan to follow-up in 3 months or sooner if needed.   Aliene Altes, PA-C [facility]

## 2022-03-28 ENCOUNTER — Encounter: Payer: Self-pay | Admitting: Gastroenterology

## 2022-04-26 ENCOUNTER — Encounter (HOSPITAL_COMMUNITY)
Admission: RE | Disposition: A | Discharge status: Home / Self Care | Payer: Self-pay | Source: Home / Self Care | Attending: Internal Medicine

## 2022-04-26 ENCOUNTER — Ambulatory Visit (HOSPITAL_COMMUNITY)
Admission: RE | Admit: 2022-04-26 | Discharge: 2022-04-26 | Disposition: A | Discharge status: Home / Self Care | Payer: 59 | Attending: Internal Medicine | Admitting: Internal Medicine

## 2022-04-26 ENCOUNTER — Ambulatory Visit (HOSPITAL_COMMUNITY): Payer: 59 | Admitting: Anesthesiology

## 2022-04-26 ENCOUNTER — Other Ambulatory Visit: Payer: Self-pay

## 2022-04-26 ENCOUNTER — Ambulatory Visit (HOSPITAL_BASED_OUTPATIENT_CLINIC_OR_DEPARTMENT_OTHER): Payer: 59 | Admitting: Anesthesiology

## 2022-04-26 ENCOUNTER — Encounter (HOSPITAL_COMMUNITY): Payer: Self-pay

## 2022-04-26 DIAGNOSIS — G8929 Other chronic pain: Secondary | ICD-10-CM | POA: Diagnosis not present

## 2022-04-26 DIAGNOSIS — I10 Essential (primary) hypertension: Secondary | ICD-10-CM | POA: Insufficient documentation

## 2022-04-26 DIAGNOSIS — R1310 Dysphagia, unspecified: Secondary | ICD-10-CM | POA: Diagnosis not present

## 2022-04-26 DIAGNOSIS — F419 Anxiety disorder, unspecified: Secondary | ICD-10-CM | POA: Diagnosis not present

## 2022-04-26 DIAGNOSIS — J45909 Unspecified asthma, uncomplicated: Secondary | ICD-10-CM | POA: Diagnosis not present

## 2022-04-26 DIAGNOSIS — K2950 Unspecified chronic gastritis without bleeding: Secondary | ICD-10-CM | POA: Diagnosis not present

## 2022-04-26 DIAGNOSIS — Z791 Long term (current) use of non-steroidal anti-inflammatories (NSAID): Secondary | ICD-10-CM | POA: Diagnosis not present

## 2022-04-26 DIAGNOSIS — K2970 Gastritis, unspecified, without bleeding: Secondary | ICD-10-CM

## 2022-04-26 DIAGNOSIS — Z8711 Personal history of peptic ulcer disease: Secondary | ICD-10-CM | POA: Diagnosis not present

## 2022-04-26 DIAGNOSIS — K8020 Calculus of gallbladder without cholecystitis without obstruction: Secondary | ICD-10-CM | POA: Diagnosis not present

## 2022-04-26 DIAGNOSIS — K219 Gastro-esophageal reflux disease without esophagitis: Secondary | ICD-10-CM | POA: Insufficient documentation

## 2022-04-26 DIAGNOSIS — M419 Scoliosis, unspecified: Secondary | ICD-10-CM | POA: Insufficient documentation

## 2022-04-26 DIAGNOSIS — Z9049 Acquired absence of other specified parts of digestive tract: Secondary | ICD-10-CM | POA: Diagnosis not present

## 2022-04-26 DIAGNOSIS — Z79899 Other long term (current) drug therapy: Secondary | ICD-10-CM | POA: Insufficient documentation

## 2022-04-26 DIAGNOSIS — R7989 Other specified abnormal findings of blood chemistry: Secondary | ICD-10-CM | POA: Diagnosis not present

## 2022-04-26 HISTORY — PX: ESOPHAGEAL BRUSHING: SHX6842

## 2022-04-26 HISTORY — PX: BALLOON DILATION: SHX5330

## 2022-04-26 HISTORY — PX: ESOPHAGOGASTRODUODENOSCOPY (EGD) WITH PROPOFOL: SHX5813

## 2022-04-26 HISTORY — PX: BIOPSY: SHX5522

## 2022-04-26 LAB — KOH PREP: KOH Prep: NONE SEEN

## 2022-04-26 SURGERY — ESOPHAGOGASTRODUODENOSCOPY (EGD) WITH PROPOFOL
Anesthesia: General

## 2022-04-26 MED ORDER — LACTATED RINGERS IV SOLN: INTRAVENOUS | Status: DC

## 2022-04-26 MED ORDER — PROPOFOL 10 MG/ML IV BOLUS
INTRAVENOUS | Status: DC | PRN
  Administered 2022-04-26: 90 mg via INTRAVENOUS
  Administered 2022-04-26: 30 mg via INTRAVENOUS
  Administered 2022-04-26 (×2): 40 mg via INTRAVENOUS

## 2022-04-26 MED ORDER — LIDOCAINE HCL (CARDIAC) PF 100 MG/5ML IV SOSY
PREFILLED_SYRINGE | INTRAVENOUS | Status: DC | PRN
  Administered 2022-04-26: 50 mg via INTRATRACHEAL

## 2022-04-26 NOTE — Anesthesia Preprocedure Evaluation (Signed)
Anesthesia Evaluation  Patient identified by MRN, date of birth, ID band Patient awake    Reviewed: Allergy & Precautions, H&P , NPO status , Patient's Chart, lab work & pertinent test results, reviewed documented beta blocker date and time   History of Anesthesia Complications (+) PONV and history of anesthetic complications  Airway Mallampati: II  TM Distance: >3 FB Neck ROM: full    Dental no notable dental hx.    Pulmonary asthma ,    Pulmonary exam normal breath sounds clear to auscultation       Cardiovascular Exercise Tolerance: Good hypertension, negative cardio ROS   Rhythm:regular Rate:Normal     Neuro/Psych PSYCHIATRIC DISORDERS Anxiety negative neurological ROS     GI/Hepatic Neg liver ROS, GERD  Medicated,  Endo/Other  negative endocrine ROS  Renal/GU negative Renal ROS  negative genitourinary   Musculoskeletal   Abdominal   Peds  Hematology negative hematology ROS (+)   Anesthesia Other Findings   Reproductive/Obstetrics negative OB ROS                             Anesthesia Physical Anesthesia Plan  ASA: 2  Anesthesia Plan: General   Post-op Pain Management:    Induction:   PONV Risk Score and Plan: Propofol infusion  Airway Management Planned:   Additional Equipment:   Intra-op Plan:   Post-operative Plan:   Informed Consent: I have reviewed the patients History and Physical, chart, labs and discussed the procedure including the risks, benefits and alternatives for the proposed anesthesia with the patient or authorized representative who has indicated his/her understanding and acceptance.     Dental Advisory Given  Plan Discussed with: CRNA  Anesthesia Plan Comments:         Anesthesia Quick Evaluation

## 2022-04-26 NOTE — Op Note (Signed)
[facility] Patient Name: Patty White Procedure Date: 04/26/2022 7:09 AM MRN: [id number] Date of Birth: August 17, 1965 Attending MD: Elon Alas. Abbey Chatters DO CSN: [id number] Age: 56 Admit Type: Outpatient Procedure:                Upper GI endoscopy Indications:              Dysphagia, Heartburn Providers:                Elon Alas. Abbey Chatters, DO, Caprice Kluver, Raphael Gibney,                            Technician Referring MD:              Medicines:                See the Anesthesia note for documentation of the                            administered medications Complications:            No immediate complications. Estimated Blood Loss:     Estimated blood loss was minimal. Procedure:                Pre-Anesthesia Assessment:                           - The anesthesia plan was to use monitored                            anesthesia care (MAC).                           After obtaining informed consent, the endoscope was                            passed under direct vision. Throughout the                            procedure, the patient's blood pressure, pulse, and                            oxygen saturations were monitored continuously. The                            GIF-H190 ([id number]) scope was introduced through the                            mouth, and advanced to the second part of duodenum.                            The upper GI endoscopy was accomplished without                            difficulty. The patient tolerated the procedure                            well. Scope In: 7:45:43 AM Scope Out:  7:52:38 AM Total Procedure Duration: 0 hours 6 minutes 55 seconds  Findings:      Mild mucosal changes characterized by white specks were found in the       middle third of the esophagus. Cells for cytology were obtained by       brushing.      No endoscopic abnormality was evident in the esophagus to explain the       patient's complaint of dysphagia. Preparations were made  for empiric       dilation. A TTS dilator was passed through the scope. Dilation with an       18-19-20 mm balloon dilator was performed to 20 mm. Dilation was       performed with a mild resistance at 20 mm. Estimated blood loss was none.      Patchy mild inflammation characterized by erythema was found in the       gastric body. Biopsies were taken with a cold forceps for Helicobacter       pylori testing.      The duodenal bulb, first portion of the duodenum and second portion of       the duodenum were normal. Impression:               - White specked mucosa in the esophagus. Cells for                            cytology obtained.                           - Gastritis. Biopsied.                           - Normal duodenal bulb, first portion of the                            duodenum and second portion of the duodenum. Moderate Sedation:      Per Anesthesia Care Recommendation:           - Patient has a contact number available for                            emergencies. The signs and symptoms of potential                            delayed complications were discussed with the                            patient. Return to normal activities tomorrow.                            Written discharge instructions were provided to the                            patient.                           - Resume previous diet.                           -  Continue present medications.                           - Await pathology results.                           - Repeat upper endoscopy PRN for retreatment.                           - Return to GI clinic in 3 months.                           - Use Protonix (pantoprazole) 40 mg PO BID.                           - Limit ibuprofen, naproxen, or other non-steroidal                            anti-inflammatory drugs. Procedure Code(s):        --- Professional ---                           [phone], Esophagogastroduodenoscopy, flexible,                             transoral; with biopsy, single or multiple Diagnosis Code(s):        --- Professional ---                           K22.8, Other specified diseases of esophagus                           K29.70, Gastritis, unspecified, without bleeding                           R13.10, Dysphagia, unspecified                           R12, Heartburn CPT copyright 2019 American Medical Association. All rights reserved. The codes documented in this report are preliminary and upon coder review may  be revised to meet current compliance requirements. Elon Alas. Abbey Chatters, DO [place] Domique Reardon, DO 04/26/2022 8:01:26 AM This report has been signed electronically. Number of Addenda: 0

## 2022-04-26 NOTE — Transfer of Care (Signed)
Immediate Anesthesia Transfer of Care Note  Patient: Patty White  Procedure(s) Performed: ESOPHAGOGASTRODUODENOSCOPY (EGD) WITH PROPOFOL BALLOON DILATION BIOPSY ESOPHAGEAL BRUSHING  Patient Location: Endoscopy Unit  Anesthesia Type:General  Level of Consciousness: awake, alert , oriented and patient cooperative  Airway & Oxygen Therapy: Patient Spontanous Breathing  Post-op Assessment: Report given to RN, Post -op Vital signs reviewed and stable and Patient moving all extremities  Post vital signs: Reviewed and stable  Last Vitals:  Vitals Value Taken Time  BP 118/81 04/26/22 0758  Temp 36.7 C 04/26/22 0758  Pulse 78 04/26/22 0758  Resp 13 04/26/22 0758  SpO2 100 % 04/26/22 0758    Last Pain:  Vitals:   04/26/22 0758  TempSrc: Oral  PainSc: 0-No pain      Patients Stated Pain Goal: 6 (40/08/67 6195)  Complications: No notable events documented.

## 2022-04-26 NOTE — Discharge Instructions (Addendum)
EGD Discharge instructions Please read the instructions outlined below and refer to this sheet in the next few weeks. These discharge instructions provide you with general information on caring for yourself after you leave the hospital. Your doctor may also give you specific instructions. While your treatment has been planned according to the most current medical practices available, unavoidable complications occasionally occur. If you have any problems or questions after discharge, please call your doctor. ACTIVITY You may resume your regular activity but move at a slower pace for the next 24 hours.  Take frequent rest periods for the next 24 hours.  Walking will help expel (get rid of) the air and reduce the bloated feeling in your abdomen.  No driving for 24 hours (because of the anesthesia (medicine) used during the test).  You may shower.  Do not sign any important legal documents or operate any machinery for 24 hours (because of the anesthesia used during the test).  NUTRITION Drink plenty of fluids.  You may resume your normal diet.  Begin with a light meal and progress to your normal diet.  Avoid alcoholic beverages for 24 hours or as instructed by your caregiver.  MEDICATIONS You may resume your normal medications unless your caregiver tells you otherwise.  WHAT YOU CAN EXPECT TODAY You may experience abdominal discomfort such as a feeling of fullness or "gas" pains.  FOLLOW-UP Your doctor will discuss the results of your test with you.  SEEK IMMEDIATE MEDICAL ATTENTION IF ANY OF THE FOLLOWING OCCUR: Excessive nausea (feeling sick to your stomach) and/or vomiting.  Severe abdominal pain and distention (swelling).  Trouble swallowing.  Temperature over 101 F (37.8 C).  Rectal bleeding or vomiting of blood.    Your EGD revealed mild amount inflammation in your stomach.  I took biopsies of this to rule out infection with a bacteria called H. pylori.  Await pathology results, my  office will contact you.  I did stretch your esophagus. Hopefully this helps with the feeling of food getting stuck in your substernal area.   I also took samples of your esophagus.  Continue on pantoprazole 40 mg twice daily. Follow up with GI in 3 months.   I hope you have a great rest of your week!  Elon Alas. Abbey Chatters, D.O. Gastroenterology and Hepatology [facility]

## 2022-04-26 NOTE — Interval H&P Note (Signed)
History and Physical Interval Note:  04/26/2022 7:36 AM  Patty White  has presented today for surgery, with the diagnosis of dysphagia, abd pain.  The various methods of treatment have been discussed with the patient and family. After consideration of risks, benefits and other options for treatment, the patient has consented to  Procedure(s) with comments: ESOPHAGOGASTRODUODENOSCOPY (EGD) WITH PROPOFOL (N/A) - 7:30am, asa 2 BALLOON DILATION (N/A) as a surgical intervention.  The patient's history has been reviewed, patient examined, no change in status, stable for surgery.  I have reviewed the patient's chart and labs.  Questions were answered to the patient's satisfaction.     Eloise Harman

## 2022-04-27 LAB — SURGICAL PATHOLOGY

## 2022-04-27 NOTE — Anesthesia Postprocedure Evaluation (Signed)
Anesthesia Post Note  Patient: Michaila Kenney  Procedure(s) Performed: ESOPHAGOGASTRODUODENOSCOPY (EGD) WITH PROPOFOL BALLOON DILATION BIOPSY ESOPHAGEAL BRUSHING  Patient location during evaluation: Phase II Anesthesia Type: General Level of consciousness: awake Pain management: pain level controlled Vital Signs Assessment: post-procedure vital signs reviewed and stable Respiratory status: spontaneous breathing and respiratory function stable Cardiovascular status: blood pressure returned to baseline and stable Postop Assessment: no headache and no apparent nausea or vomiting Anesthetic complications: no Comments: Late entry   No notable events documented.   Last Vitals:  Vitals:   04/26/22 0644 04/26/22 0758  BP: (!) 147/90 118/81  Pulse: 75 78  Resp: 17 13  Temp: 36.7 C 36.7 C  SpO2: 100% 100%    Last Pain:  Vitals:   04/26/22 0758  TempSrc: Oral  PainSc: 0-No pain                 Louann Sjogren

## 2022-05-01 ENCOUNTER — Encounter (HOSPITAL_COMMUNITY): Payer: Self-pay | Admitting: Internal Medicine

## 2022-06-12 NOTE — Progress Notes (Unsigned)
Primary Care Physician:  Valentino Nose, FNP  Primary Gastroenterologist: Elon Alas.Abbey Chatters, DO  Patient Location: Home Reason for Visit: follow up  Persons present on the virtual encounter, with roles: Patient - Patty White; Provider - Venetia Night, NP   Total time (minutes) spent on medical discussion: 17 minutes  Virtual Visit Encounter Note Visit is conducted virtually and was requested by patient.   I connected with Patty White on 06/14/22 at 10:30 AM EST by video and verified that I am speaking with the correct person using two identifiers.   I discussed the limitations, risks, security and privacy concerns of performing an evaluation and management service by video and the availability of in person appointments. I also discussed with the patient that there may be a patient responsible charge related to this service. The patient expressed understanding and agreed to proceed.  Chief Complaint  Patient presents with   Follow-up     History of Present Illness: Patty White is a 56 y.o. female with a history of chronic GERD, PUD 15 years ago, dysphagia, asthma, HTN, hidradenitis suppurativa who presents for virtual follow up.    Prior notes reporting failed history of aciphex and was taking 3 omeprazole daily. Previously taking NSAIDs daily. Also taking Zegerid daily. Also with previously elevated liver enzymes in Feb 2023. Admitted to alcohol use. RUQ [place] earlier this year with cholelithiasis and hepatic steatosis. Previously declined TCS, wanted to do Cologuard.   Las in June 2023 with positive ANA 1:320, ASMA elevated at 71, Ama negative. Normal immunoglobulins, Iron panel WNL, not immune to Hep A or Hep B. Workup negative for Hep B and C. Recommended to recheck labs in 3 months and if LFTs remain elevated consider MRI and possible liver biopsy.   EGD scheduled and subsequently cancelled by patient stating she did not need procedure and wanted evaluation and  referral to have gallbladder removed. Reported dysphagia improved.   Cholecystectomy in July 2023 with liver biopsy performed with no specific pathology and negative for steatosis, inflammation, or fibrosis. No iron detected.   Last office visit 03/27/2022. Only complaint is chronic pain. Reportedly still taking 3 omeprazole daily with frequent breakthrough symptoms. Dexilant too expensive and some intermittent N/V. Taking 4 naproxen daily and 6 ibuprofen daily for back pain and headaches. Occasional goody powder use. Sensation of foods stuck in mid esophagus. Ongoing upper abdominal pain. Does not look at stool. Declined TCS and unable to perform Cologuard. Ongoing alcohol use. Plan to repeat HFP, stop omeprazole and zegerid. Start pantoprazole 40 mg BID and pepcid for breakthrough. GERD diet, reduce alcohol intake. Avoid NSAIDs. EGD/ED ordered.   EGD 04/26/2022:  -White specks in esophagus s/p biopsy (KOH negative) -gastritis s/p biopsy (reactive gastropathy, negative H Pylori) -no esophageal abnormality, s/p dilation -continue PPI BID, avoid NSAIDs.  HFP not completed.    Today: GERD - Still taking Zegerid every so often with the pantoprazole. Not taking it everyday. [place] foods are typically very bothersome for her. Does have some nausea (Zofran controls it), takes it once per week or once every 2 weeks. No vomiting. Has a good appetite. No typical heartburn symtpoms unless she eats a trigger food. Unable to look at her stool - has a weak stomach. Quit taking goodys, still takes aleve and ibuprofen. Getting injections to help with some of her pain. On cymbalta now too. Drinks wine almost everyday. Has been having some emotional troubles.   Does smoke marijuana.  Denies constipation or diarrhea or abdominal  pain.   Dysphagia - Improved. Now can take a handful of pills and not gagging. Food is going down easier as well.    Medications Current Meds  Medication Sig   albuterol (PROVENTIL  HFA;VENTOLIN HFA) 108 (90 BASE) MCG/ACT inhaler 1-2 puffs q 4-6 hrs   Aspirin-Acetaminophen-Caffeine (GOODYS EXTRA STRENGTH) 500-325-65 MG PACK Take 1 packet by mouth daily as needed (pain/headaches.).   b complex vitamins capsule Take 1 capsule by mouth daily with lunch.   Brimonidine Tartrate (LUMIFY) 0.025 % SOLN Place 1 drop into both eyes daily as needed (eye irritation.).   busPIRone (BUSPAR) 30 MG tablet Take 30 mg by mouth 3 (three) times daily.   cetirizine (ZYRTEC) 10 MG tablet Take 10 mg by mouth at bedtime.   clonazePAM (KLONOPIN) 1 MG tablet Take 1 mg by mouth at bedtime as needed (sleep).   DULoxetine (CYMBALTA) 60 MG capsule Take 60 mg by mouth at bedtime.   HUMIRA PEN 80 MG/0.8ML PNKT Inject 80 mg into the skin every 14 (fourteen) days.   ibuprofen (ADVIL,MOTRIN) 200 MG tablet Take 800 mg by mouth 2 (two) times daily as needed for mild pain, headache or moderate pain.   naproxen sodium (ALEVE) 220 MG tablet Take 440 mg by mouth daily as needed (pain/headaches.).   olmesartan (BENICAR) 40 MG tablet Take 40 mg by mouth at bedtime.   oxymetazoline (AFRIN) 0.05 % nasal spray Place 1 spray into both nostrils 2 (two) times daily.   pantoprazole (PROTONIX) 40 MG tablet Take 1 tablet (40 mg total) by mouth 2 (two) times daily before a meal.   triamcinolone cream (KENALOG) 0.1 % Apply 1 application  topically 2 (two) times a week. Applied to eyelids     History Past Medical History:  Diagnosis Date   Anxiety    Asthma    GERD (gastroesophageal reflux disease)    Hidradenitis suppurativa    HTN (hypertension)    PONV (postoperative nausea and vomiting)    Scoliosis     Past Surgical History:  Procedure Laterality Date   arm surgery     BALLOON DILATION N/A 04/26/2022   Procedure: BALLOON DILATION;  Surgeon: Eloise Harman, DO;  Location: [facility];  Service: Endoscopy;  Laterality: N/A;   BIOPSY  04/26/2022   Procedure: BIOPSY;  Surgeon: Eloise Harman, DO;   Location: [facility];  Service: Endoscopy;;   CESAREAN SECTION     CHOLECYSTECTOMY N/A 01/26/2022   Procedure: LAPAROSCOPIC CHOLECYSTECTOMY;  Surgeon: Virl Cagey, MD;  Location: [facility];  Service: General;  Laterality: N/A;   ESOPHAGEAL BRUSHING  04/26/2022   Procedure: ESOPHAGEAL BRUSHING;  Surgeon: Eloise Harman, DO;  Location: [facility];  Service: Endoscopy;;   ESOPHAGOGASTRODUODENOSCOPY (EGD) WITH PROPOFOL N/A 04/26/2022   Procedure: ESOPHAGOGASTRODUODENOSCOPY (EGD) WITH PROPOFOL;  Surgeon: Eloise Harman, DO;  Location: [facility];  Service: Endoscopy;  Laterality: N/A;  7:30am, asa 2   FRACTURE SURGERY     LIVER BIOPSY N/A 01/26/2022   Procedure: LIVER BIOPSY;  Surgeon: Virl Cagey, MD; no specific pathologic diagnosis, negative for steatosis, inflammation and fibrosis, and no stainable iron detected.    Family History  Problem Relation Age of Onset   Cancer Other    Colon cancer Neg Hx     Social History   Socioeconomic History   Marital status: Married    Spouse name: Not on file   Number of children: Not on file   Years of education: Not  on file   Highest education level: Not on file  Occupational History   Not on file  Tobacco Use   Smoking status: Never   Smokeless tobacco: Never  Substance and Sexual Activity   Alcohol use: Yes    Comment: 2 glasses of red wine nightly. States she "drinks to get drunk" on the weekend. Usually 1 "jug" of wine per weekend.   Drug use: No   Sexual activity: Yes    Birth control/protection: None  Other Topics Concern   Not on file  Social History Narrative   Not on file   Social Determinants of Health   Financial Resource Strain: Not on file  Food Insecurity: Not on file  Transportation Needs: Not on file  Physical Activity: Not on file  Stress: Not on file  Social Connections: Not on file      Review of Systems: Gen: Denies fever, chills, anorexia. Denies fatigue, weakness, weight loss.   CV: Denies chest pain, palpitations, syncope, peripheral edema, and claudication. Resp: Denies dyspnea at rest, cough, wheezing, coughing up blood, and pleurisy. GI: see HPI Derm: Denies rash, itching, dry skin Psych: Denies depression, anxiety, memory loss, confusion. No homicidal or suicidal ideation.  Heme: Denies bruising, bleeding, and enlarged lymph nodes.  Observations/Objective: No distress. Alert and oriented. Pleasant. Well nourished. Normal mood and affect. Unable to perform complete physical exam due to video encounter.   Assessment:  GERD: Chronic history. Dexilant too expensive, failed omeprazole, aciphex, and zegerid in the past. Currently on pantoprazole 40 mg twice daily. EGD with gastritis, negative for H. Pylori.  Has been doing fairly well on pantoprazole 40 mg daily.  Admits to taking Zegerid occasionally, especially if eating [place] food.  I advised her to stop Zegerid and to begin famotidine 20 mg once daily, may increase to twice daily if needed for any breakthrough reflux.  Ongoing alcohol use likely contributing to her symptoms as well, advised reduction.  Dysphagia: History of dysphagia. EGD in October with no esophageal abnormality, underwent empiric dilation.  Has had improvement since dilation.  She is able to swallow pills and swallow foods without much issue.  Elevated LFTs: Likely secondary to chronic alcohol use. Elevated LFTs earlier this year. RUQ [place] in March with cholelithiasis and hepatic steatosis. Underwent cholecystectomy in July 2023 with liver biopsy performed with no specific pathologic diease and no steatosis or iron staining. Previous lab evaluation with positive ANA and ASMA. Other workup unremarkable. Not immune to Hepatitis A or B. HFP in June with elevated alk phos, normal AST/ALT. No recent labs. ASMA and ANA could be elevated given HS. Will request most recent blood work performed by PCP to ensure improvement.  We discussed limiting alcohol  intake, increasing exercise, and continued weight loss.  Screening for colon cancer: No prior TCS, previously refused as well as Cologaurd.    Plan:  Request recent labs from Dr. Nevada Crane regarding LFTs Continue pantoprazole 40 mg twice daily.  Continue Zofran as needed for occasional nausea. Stop Zegerid, use famotidine for breakthrough.  GERD diet/lifestyle Limit alcohol intake Increase exercise Encouraged to complete Cologuard or some form of colon cancer screening.  May benefit from yearly FOBT at minimum. Advised to follow-up with PCP regarding abnormal Pap screen to obtain GYN referral Follow up in 6 months.      Follow Up Instructions: Follow up in 6 months, will request labs from PCP to ensure improvement in LFTs.   I discussed the assessment and treatment plan with the patient.  The patient was provided an opportunity to ask questions and all were answered. The patient agreed with the plan and demonstrated an understanding of the instructions.   The patient was advised to call back or seek an in-person evaluation if the symptoms worsen or if the condition fails to improve as anticipated.   Venetia Night, MSN, APRN, FNP-BC, AGACNP-BC [facility]

## 2022-06-14 ENCOUNTER — Encounter: Payer: Self-pay | Admitting: Gastroenterology

## 2022-06-14 ENCOUNTER — Telehealth (INDEPENDENT_AMBULATORY_CARE_PROVIDER_SITE_OTHER): Payer: 59 | Admitting: Gastroenterology

## 2022-06-14 VITALS — Ht 68.0 in | Wt 156.0 lb

## 2022-06-14 DIAGNOSIS — K219 Gastro-esophageal reflux disease without esophagitis: Secondary | ICD-10-CM | POA: Diagnosis not present

## 2022-06-14 DIAGNOSIS — R7989 Other specified abnormal findings of blood chemistry: Secondary | ICD-10-CM | POA: Diagnosis not present

## 2022-06-14 DIAGNOSIS — K760 Fatty (change of) liver, not elsewhere classified: Secondary | ICD-10-CM

## 2022-06-14 DIAGNOSIS — R1310 Dysphagia, unspecified: Secondary | ICD-10-CM | POA: Diagnosis not present

## 2022-06-14 DIAGNOSIS — Z1211 Encounter for screening for malignant neoplasm of colon: Secondary | ICD-10-CM

## 2022-06-14 NOTE — Patient Instructions (Addendum)
Continue taking pantoprazole 40 mg twice daily.  I recommend you stop taking Zegerid.   You may use famotidine 20 mg daily over-the-counter in addition to your pantoprazole.  If needed you may take this up to twice a day. Follow a GERD diet:  Avoid fried, fatty, greasy, spicy, citrus foods. Avoid caffeine and carbonated beverages. Avoid chocolate. Try eating 4-6 small meals a day rather than 3 large meals. Do not eat within 3 hours of laying down. Prop head of bed up on wood or bricks to create a 6 inch incline.  I recommend decreasing alcohol use, daily use could exacerbate your reflux symptoms.  The goal long-term is to wean off of pantoprazole.  Your biggest risk of long-term PPI use is osteoporosis.  I would recommend calcium and checking vitamin D yearly as well as performing DEXA scan when you turn 67.  Some form of colon cancer screening is recommended.  Still encourage you to do at least Cologuard if you do not want to perform a colonoscopy.  We will have you follow-up in 6 months, or sooner if needed.  It was a pleasure to see you today. I want to create trusting relationships with patients. If you receive a survey regarding your visit,  I greatly appreciate you taking time to fill this out on paper or through your MyChart. I value your feedback.  Brooke Bonito, MSN, FNP-BC, AGACNP-BC [facility]

## 2022-06-26 ENCOUNTER — Telehealth: Payer: Self-pay | Admitting: Gastroenterology

## 2022-06-26 NOTE — Telephone Encounter (Signed)
LMOM for pt to call office  

## 2022-06-26 NOTE — Telephone Encounter (Signed)
Received labs from PCPs office dated 02/27/2022 with normal albumin/creatinine urine ratio, vitamin D low at 24.5, A1c 5.3.  Labs dated May 2023 glucose 185, alk phos 232, AST 30, ALT 34, bilirubin 0.2  Patty White -please advise patient I would recommend having her liver enzymes rechecked now that she has had her gallbladder removed to assess for improvement. HFP ordered at last office visit in September.  Can place new orders for her to have labs performed via [place] or [place] which ever she prefers.  Venetia Night, MSN, APRN, FNP-BC, AGACNP-BC [facility] at [facility]

## 2022-06-28 ENCOUNTER — Encounter: Payer: Self-pay | Admitting: *Deleted

## 2022-06-28 NOTE — Telephone Encounter (Signed)
Tried several times to reach pt. Sent a [employer]ologist.

## 2022-06-29 ENCOUNTER — Other Ambulatory Visit: Payer: Self-pay | Admitting: *Deleted

## 2022-06-29 ENCOUNTER — Encounter: Payer: Self-pay | Admitting: *Deleted

## 2022-06-29 DIAGNOSIS — R7989 Other specified abnormal findings of blood chemistry: Secondary | ICD-10-CM

## 2022-06-29 NOTE — Telephone Encounter (Signed)
Mailed pt a letter and lab requisitions

## 2022-07-08 LAB — HEPATIC FUNCTION PANEL
AG Ratio: 1.6 (calc) (ref 1.0–2.5)
ALT: 16 U/L (ref 6–29)
AST: 21 U/L (ref 10–35)
Albumin: 4.5 g/dL (ref 3.6–5.1)
Alkaline phosphatase (APISO): 150 U/L (ref 37–153)
Bilirubin, Direct: 0.1 mg/dL (ref 0.0–0.2)
Globulin: 2.9 g/dL (calc) (ref 1.9–3.7)
Indirect Bilirubin: 0.4 mg/dL (calc) (ref 0.2–1.2)
Total Bilirubin: 0.5 mg/dL (ref 0.2–1.2)
Total Protein: 7.4 g/dL (ref 6.1–8.1)

## 2022-07-27 ENCOUNTER — Other Ambulatory Visit: Payer: Self-pay | Admitting: Gastroenterology

## 2022-07-27 DIAGNOSIS — K219 Gastro-esophageal reflux disease without esophagitis: Secondary | ICD-10-CM

## 2022-08-27 ENCOUNTER — Other Ambulatory Visit: Payer: Self-pay | Admitting: Gastroenterology

## 2022-08-27 DIAGNOSIS — K219 Gastro-esophageal reflux disease without esophagitis: Secondary | ICD-10-CM

## 2022-12-05 ENCOUNTER — Encounter: Payer: Self-pay | Admitting: Internal Medicine

## 2023-02-10 ENCOUNTER — Other Ambulatory Visit: Payer: Self-pay | Admitting: Gastroenterology

## 2023-02-10 DIAGNOSIS — K219 Gastro-esophageal reflux disease without esophagitis: Secondary | ICD-10-CM

## 2023-05-07 IMAGING — MR MR CERVICAL SPINE W/O CM
5 series · 36 of 48 positions shown · non-contrast
Comparison: None.

CLINICAL DATA: Cervicalgia with severe upper back and right
shoulder pain for years

EXAM:
MRI CERVICAL SPINE WITHOUT CONTRAST
TECHNIQUE: Multiplanar, multisequence MR imaging of the cervical spine was
performed. No intravenous contrast was administered.

[Series 5: t2_tse_sag_fast · sagittal · 3.0mm · 0.43mm/px · 6 of 15 slices shown]
[im 1/15]
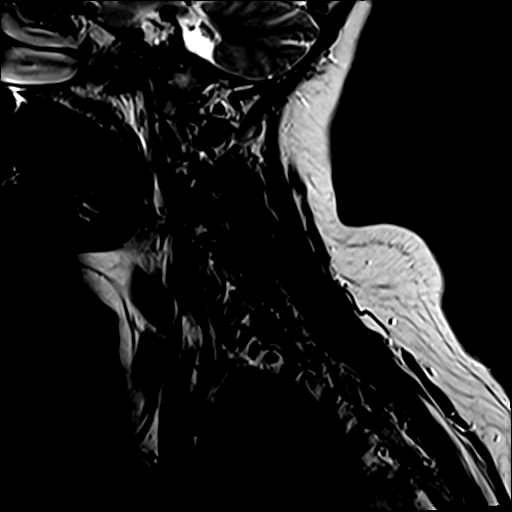
[im 3/15]
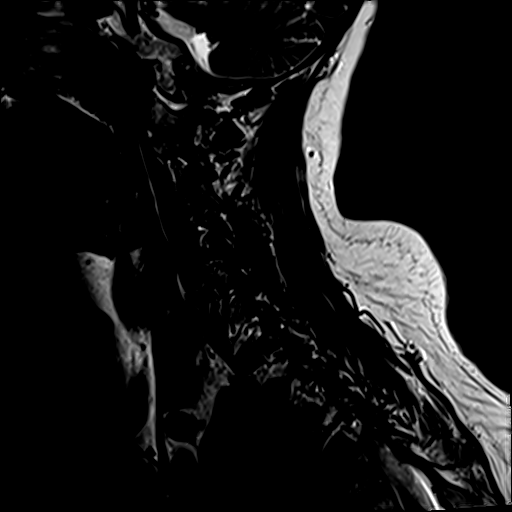
[im 6/15]
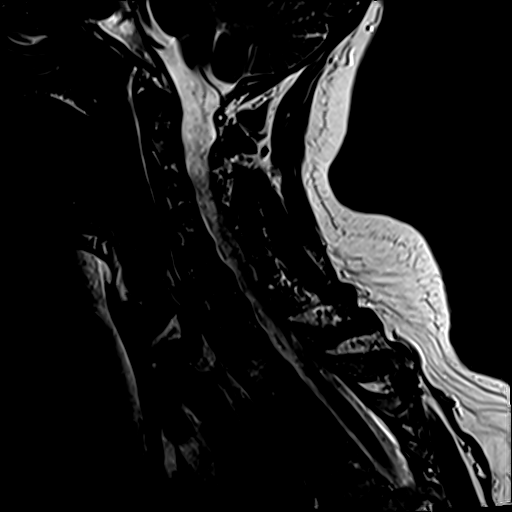
[im 9/15]
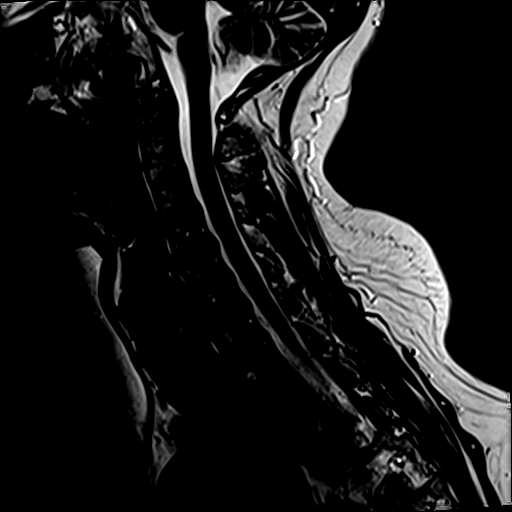
[im 12/15]
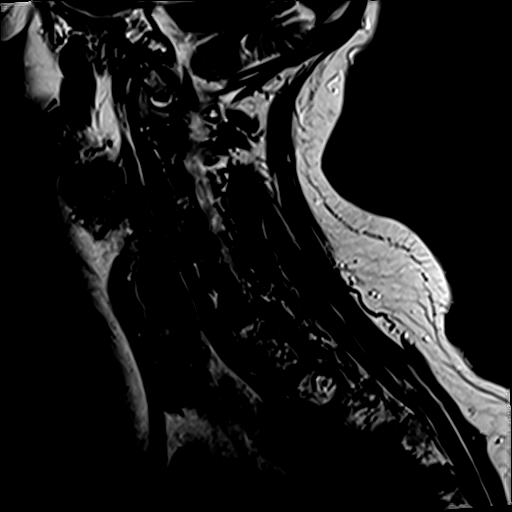
[im 15/15]
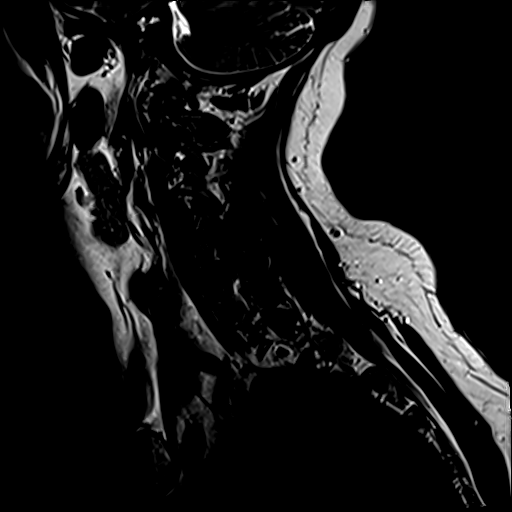

[Series 6: t1_tse_sag_fast · sagittal · 3.0mm · 0.43mm/px · 6 of 15 slices shown]
[im 1/15]
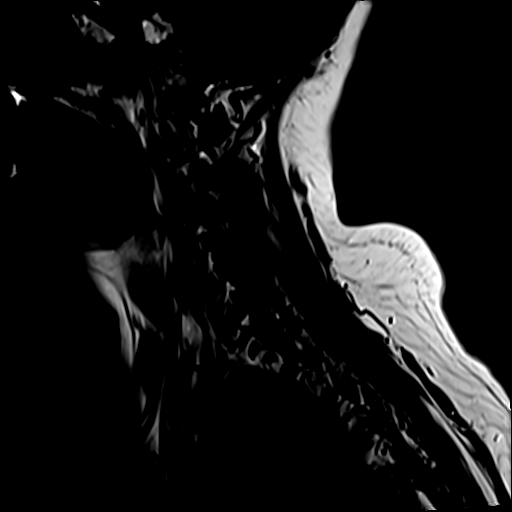
[im 3/15]
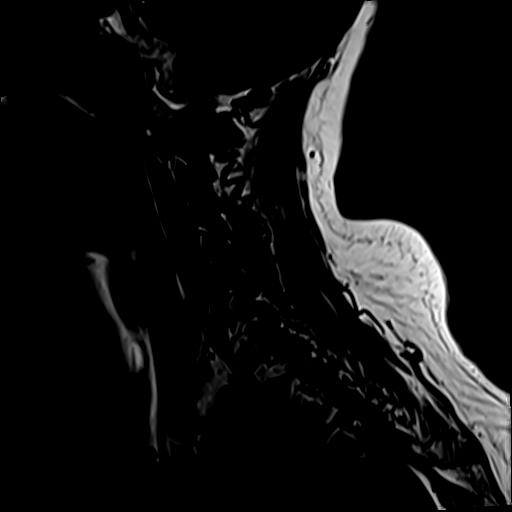
[im 6/15]
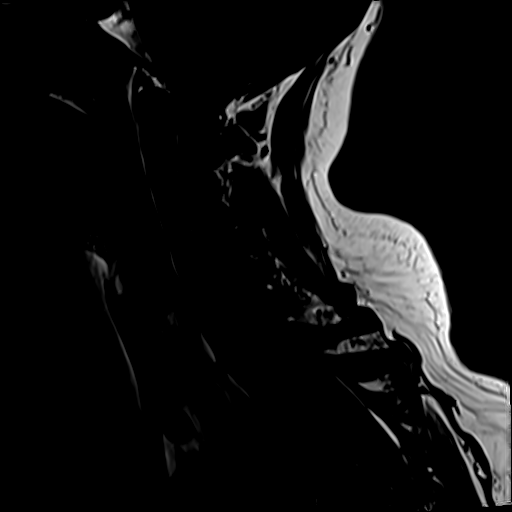
[im 9/15]
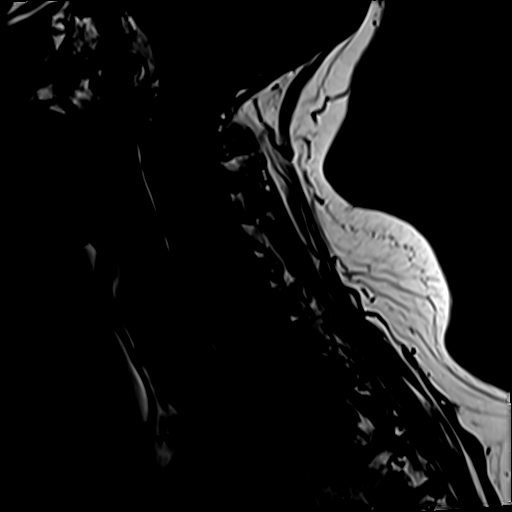
[im 12/15]
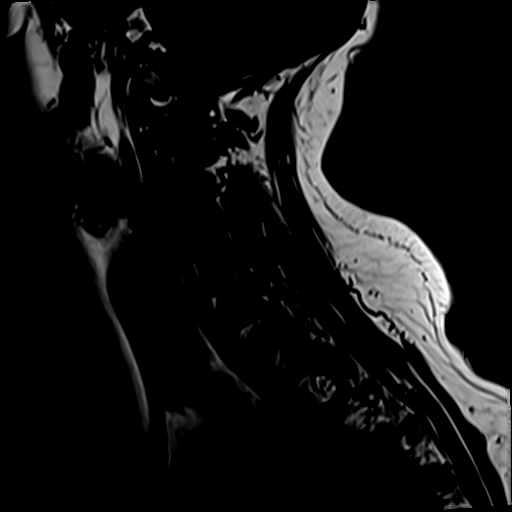
[im 15/15]
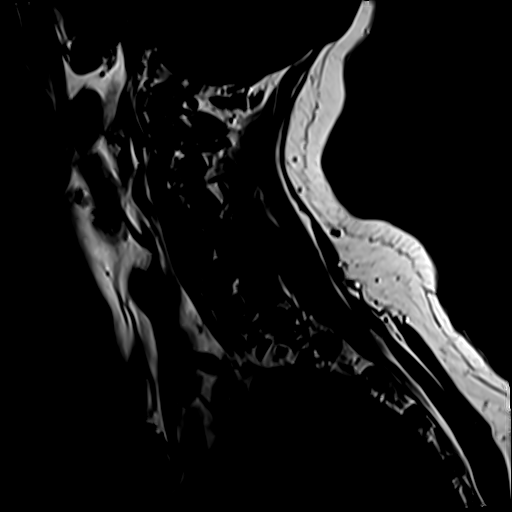

[Series 7: STIR · sagittal · 3.0mm · 0.86mm/px · 6 of 15 slices shown]
[im 1/15]
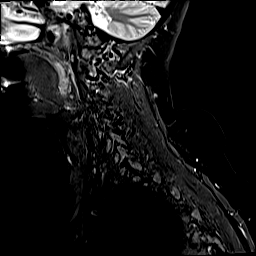
[im 3/15]
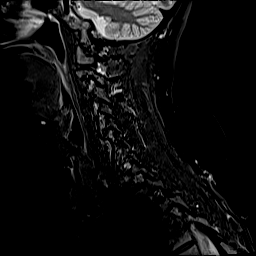
[im 6/15]
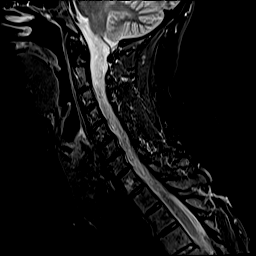
[im 9/15]
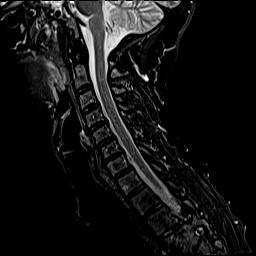
[im 12/15]
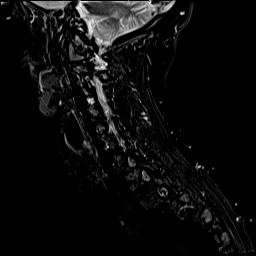
[im 15/15]
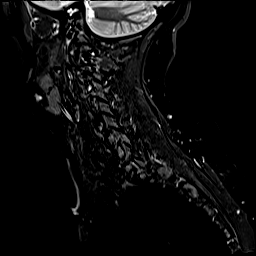

[Series 8: t2_tse_tra_fast · axial · 3.0mm · 0.78mm/px · z∈[-110,+1]mm · 10 of 37 slices shown]
[im 1/37]
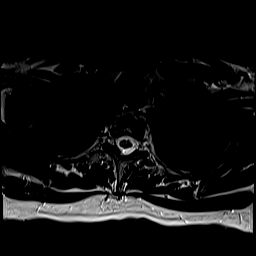
[im 3/37]
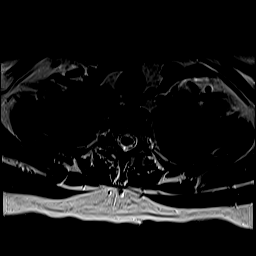
[im 6/37]
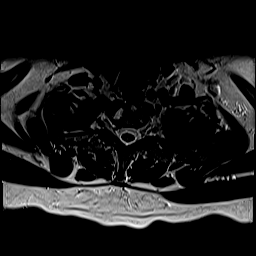
[im 11/37]
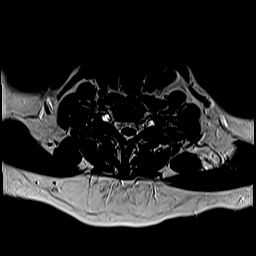
[im 16/37]
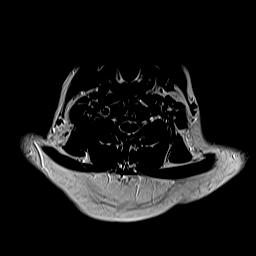
[im 19/37]
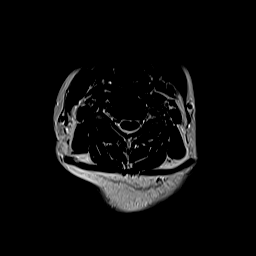
[im 21/37]
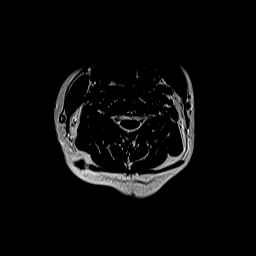
[im 26/37]
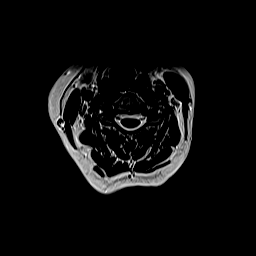
[im 31/37]
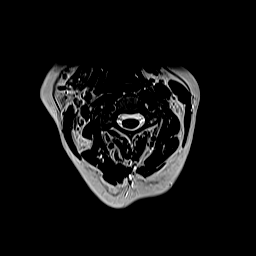
[im 37/37]
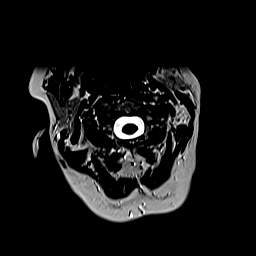

[Series 9: GRE · axial · 3.0mm · 0.78mm/px · z∈[-110,+1]mm · 8 of 37 slices shown]
[im 1/37]
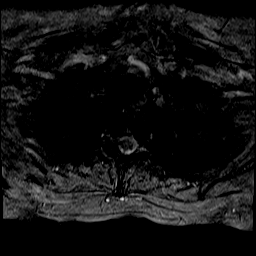
[im 6/37]
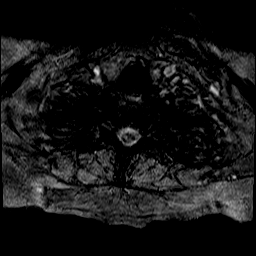
[im 11/37]
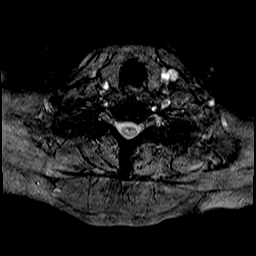
[im 16/37]
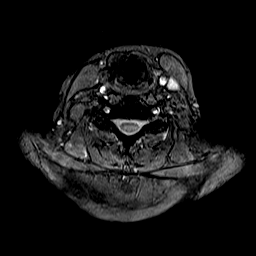
[im 21/37]
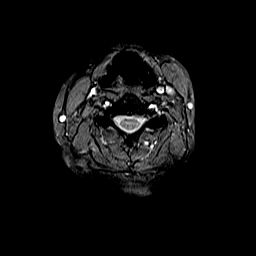
[im 26/37]
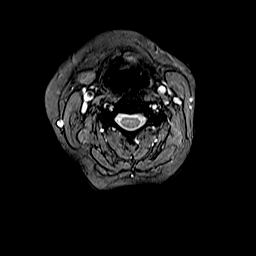
[im 31/37]
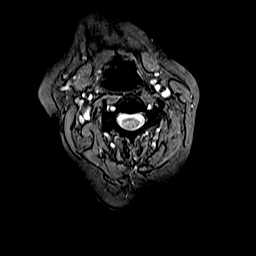
[im 37/37]
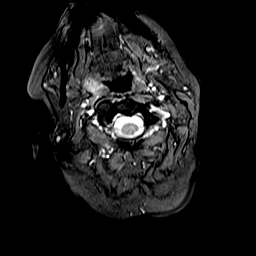

[36 of 48 positions shown; findings below may reference images not displayed]

FINDINGS: Alignment: Negative for listhesis.

Vertebrae: No fracture, evidence of discitis, or aggressive bone
lesion. Hemangiomas in the T1 vertebra, more fat poor toward is the
right.

Cord: Normal signal and morphology.

Posterior Fossa, vertebral arteries, paraspinal tissues: No
perispinal mass or inflammation noted.

Disc levels:

C2-3: Unremarkable.

C3-4: Unremarkable.

C4-5: Disc narrowing and bulging with left uncovertebral spurring.
Mild left foraminal narrowing. Patent spinal canal

C5-6: Disc narrowing with biforaminal bulging/uncovertebral
spurring. Patent canal and foramina.

C6-7: Disc narrowing and bulging with uncovertebral spurring
eccentric to the right. Mild left and moderate right foraminal
narrowing.

C7-T1:Unremarkable.
IMPRESSION: 1. Generalized cervical spine degeneration withmoderate foraminal
narrowing on the right at C6-7 and mild foraminal narrowing on the
left at C4-5 and C6-7.
2. Diffusely patent spinal canal.

## 2023-05-07 IMAGING — US US ABDOMEN LIMITED
1 series · 14 of 25 positions shown · non-contrast
Comparison: US Abdomen, 08/20/2019.

CLINICAL DATA: Abnormal results of liver function studies

EXAM:
ULTRASOUND ABDOMEN LIMITED RIGHT UPPER QUADRANT

[Series 1: us abdomen limited ruq (liver/gb) · 14 of 48 slices shown]
[im 1/48]
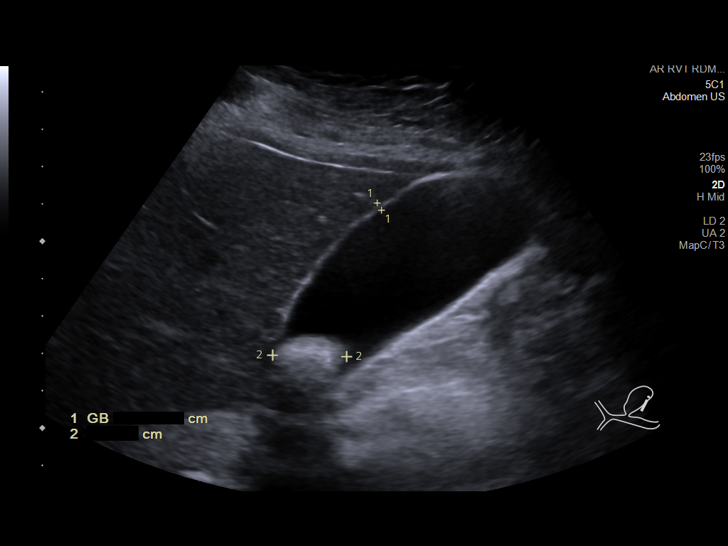
[im 4/48]
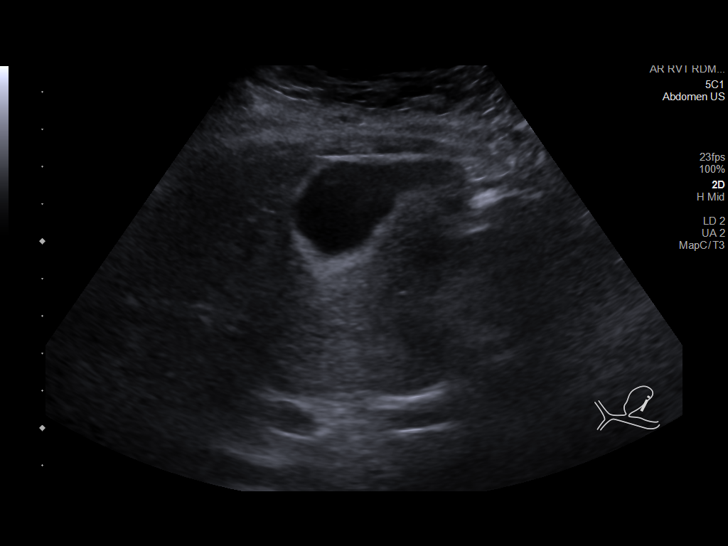
[im 8/48]
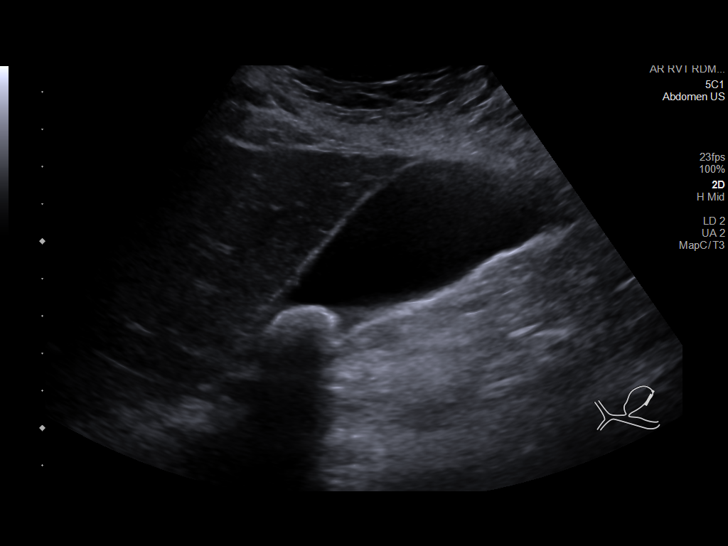
[im 12/48]
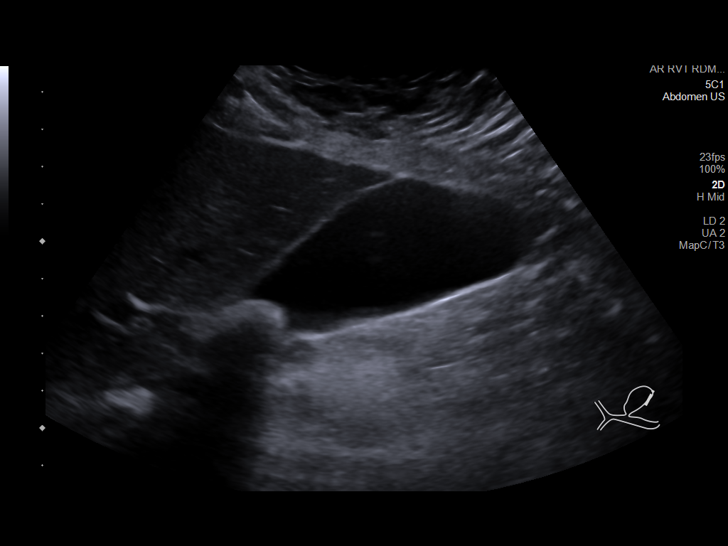
[im 16/48]
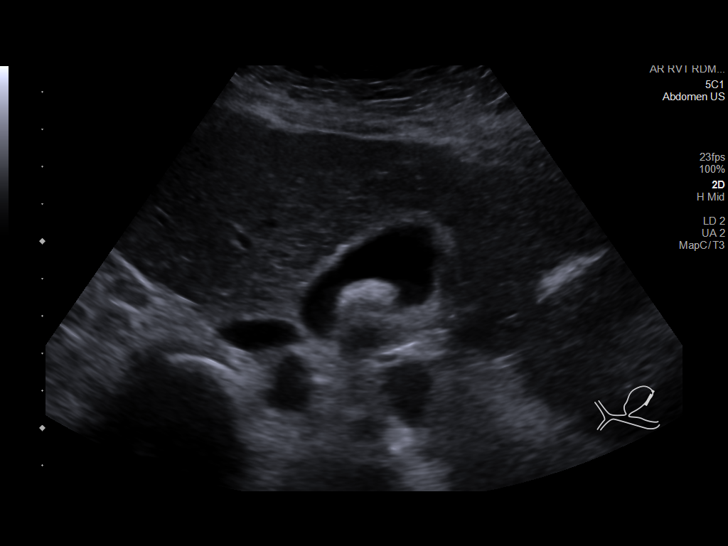
[im 18/48]
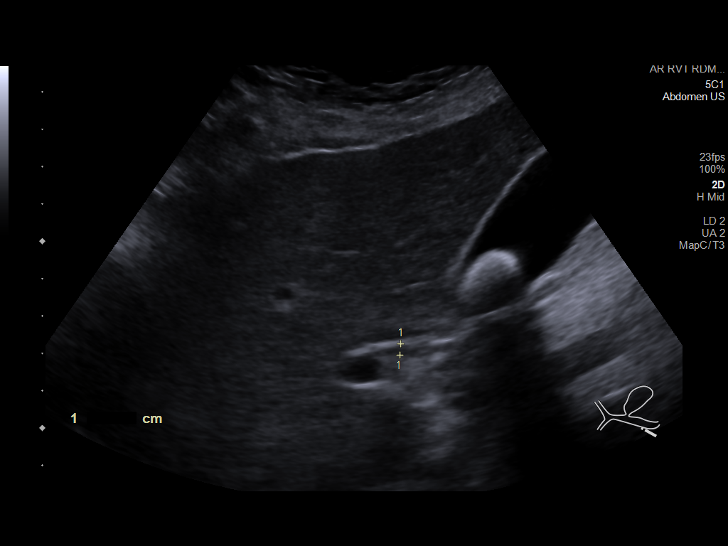
[im 22/48]
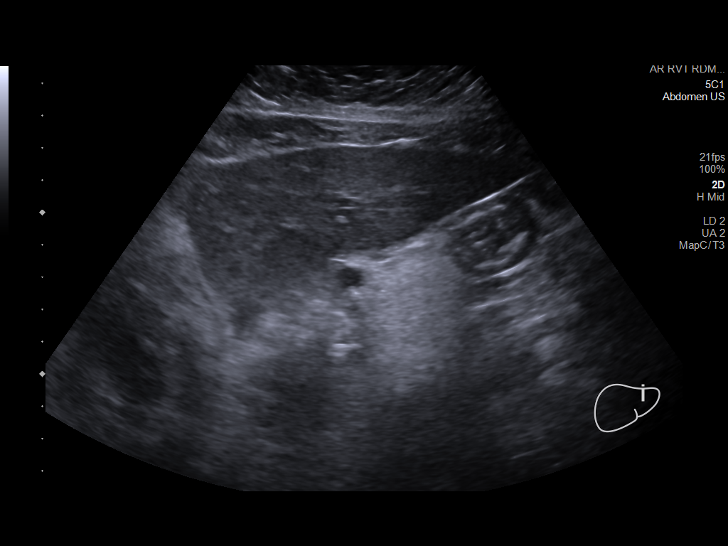
[im 26/48]
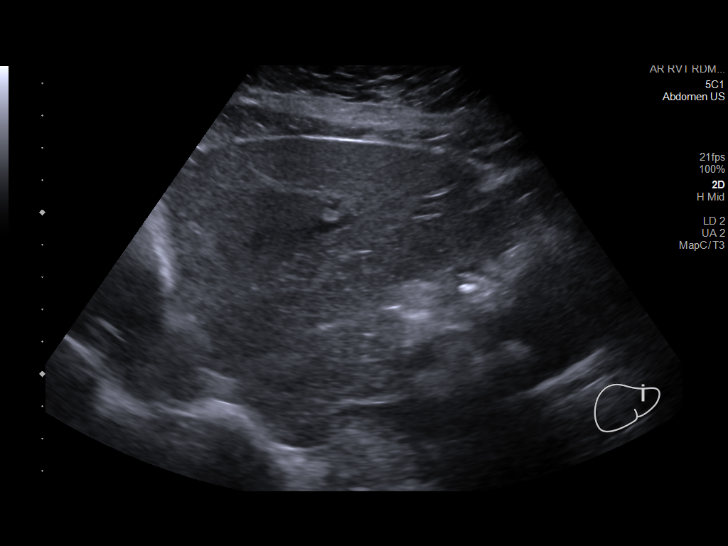
[im 30/48]
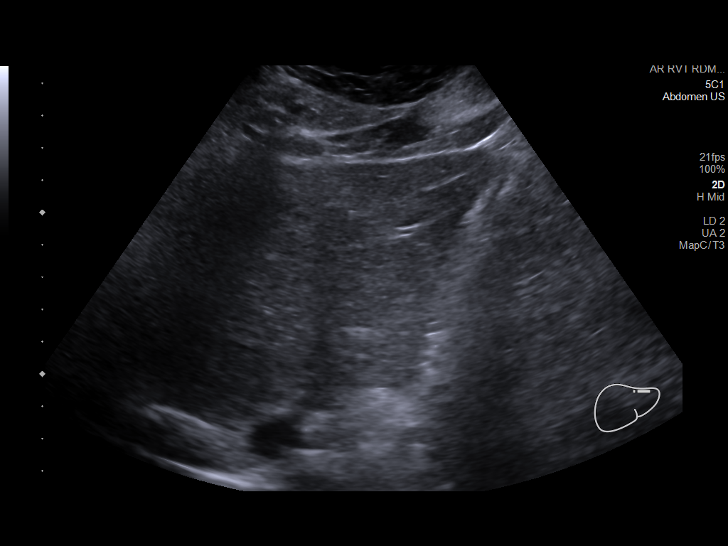
[im 32/48]
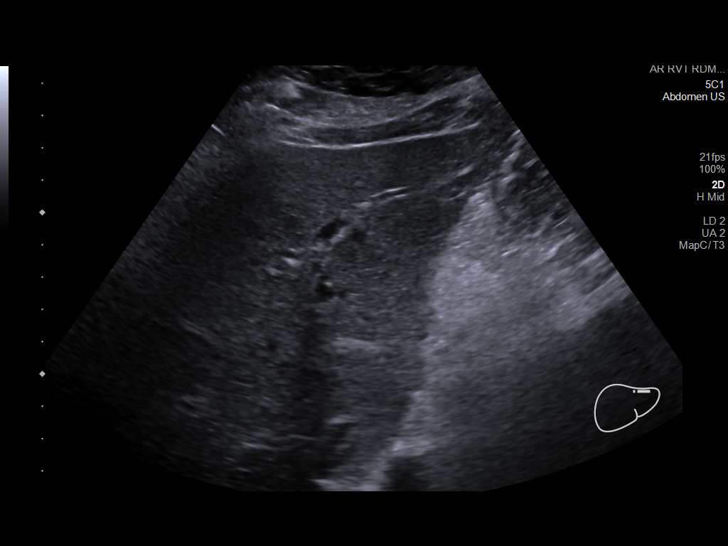
[im 36/48]
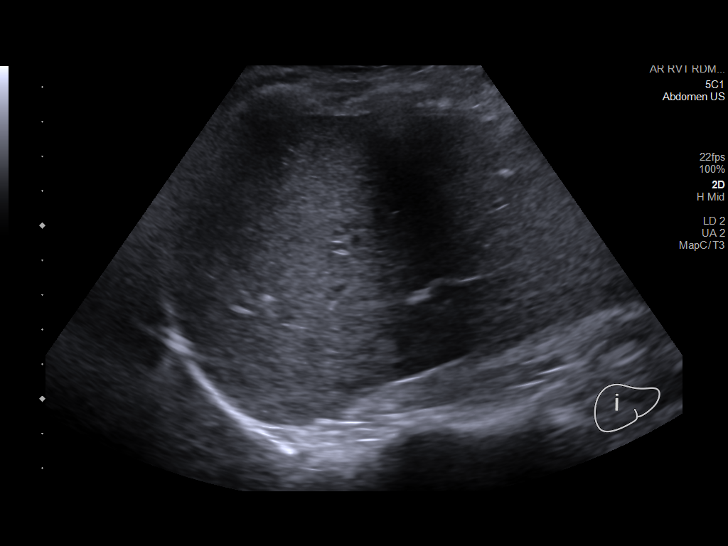
[im 40/48]
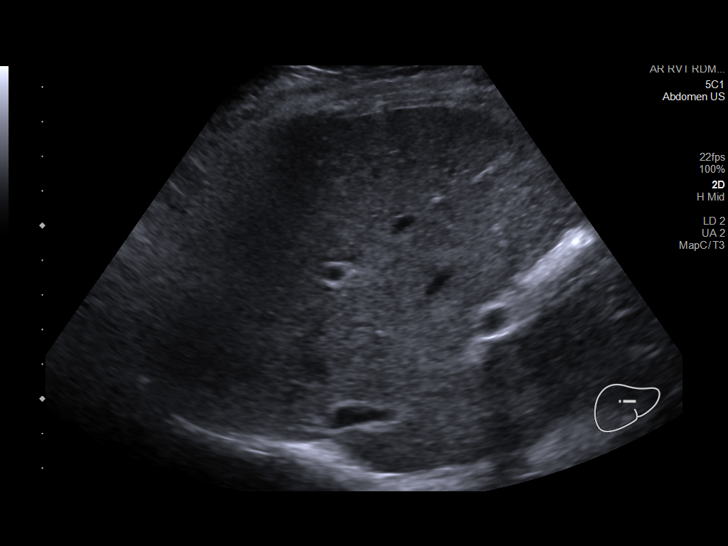
[im 44/48]
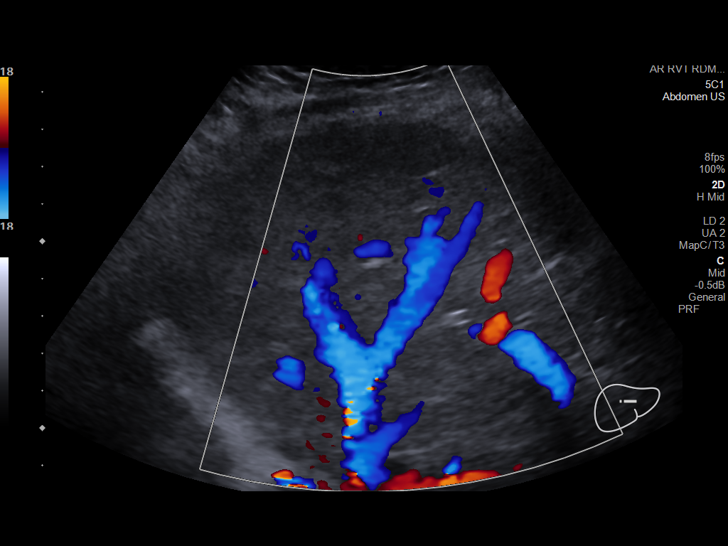
[im 48/48]
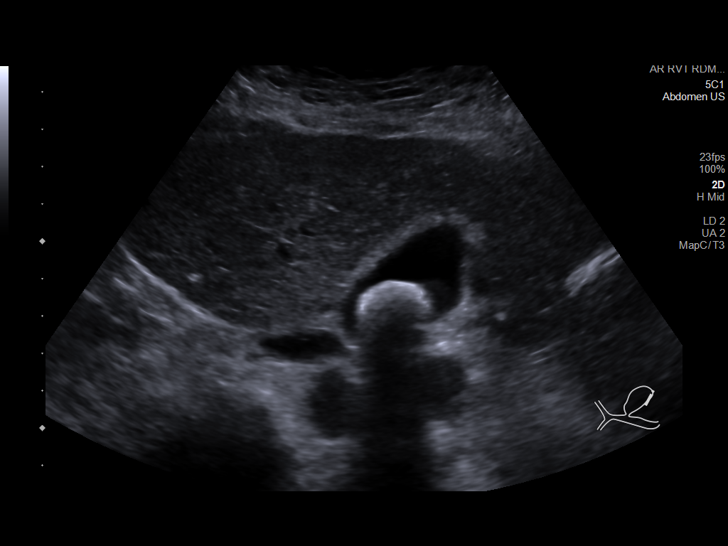

[14 of 25 positions shown; findings below may reference images not displayed]

FINDINGS: Gallbladder:

Mobile, dependent echogenic gallstone within a minimally distended
gallbladder measuring up to 2.0 cm in greatest dimension.

No wall thickening visualized. No sonographic Murphy sign noted by
sonographer.

Common bile duct:

Diameter: 0.3 cm

Liver:

No focal lesion identified. Increased hepatic parenchymal
echogenicity. Portal vein is patent on color Doppler imaging with
normal direction of blood flow towards the liver.

Other: None.
IMPRESSION: 1. Cholelithiasis. No additional sonographic findings to suggest
acute cholecystitis.
Of note, mobile 2.0 cm gallstone is positioned at the gallbladder
neck and may potentially be creating in intermittent ball valve
obstruction.
2. Echogenic liver. Findings most commonly seen in hepatic
steatosis, though may also represent hepatitis and/or fibrosis.

## 2023-07-30 ENCOUNTER — Other Ambulatory Visit: Payer: Self-pay | Admitting: Gastroenterology

## 2023-07-30 DIAGNOSIS — K219 Gastro-esophageal reflux disease without esophagitis: Secondary | ICD-10-CM

## 2023-07-30 NOTE — Telephone Encounter (Signed)
Patient will need an OV for further refills of pantoprazole. Can be virtual.

## 2023-08-08 NOTE — Telephone Encounter (Signed)
Spoke to patient and she asked if her PCP could prescribe the Protonix.  She is going to ask her as she has an appointment on 1/30.  If she doesn't get anywhere with the PCP she said she would call back and schedule.

## 2023-08-30 ENCOUNTER — Other Ambulatory Visit: Payer: Self-pay | Admitting: Gastroenterology

## 2023-08-30 DIAGNOSIS — K219 Gastro-esophageal reflux disease without esophagitis: Secondary | ICD-10-CM

## 2023-09-28 ENCOUNTER — Other Ambulatory Visit: Payer: Self-pay | Admitting: Gastroenterology

## 2023-09-28 DIAGNOSIS — K219 Gastro-esophageal reflux disease without esophagitis: Secondary | ICD-10-CM

## 2023-10-23 ENCOUNTER — Other Ambulatory Visit: Payer: Self-pay | Admitting: Gastroenterology

## 2023-10-23 DIAGNOSIS — K219 Gastro-esophageal reflux disease without esophagitis: Secondary | ICD-10-CM

## 2023-11-16 ENCOUNTER — Other Ambulatory Visit (HOSPITAL_COMMUNITY): Payer: Self-pay | Admitting: Nurse Practitioner

## 2023-11-16 DIAGNOSIS — Z1231 Encounter for screening mammogram for malignant neoplasm of breast: Secondary | ICD-10-CM

## 2024-05-22 ENCOUNTER — Other Ambulatory Visit (HOSPITAL_COMMUNITY): Payer: Self-pay | Admitting: Nurse Practitioner

## 2024-05-22 DIAGNOSIS — Z1231 Encounter for screening mammogram for malignant neoplasm of breast: Secondary | ICD-10-CM

## 2024-06-09 ENCOUNTER — Ambulatory Visit (HOSPITAL_COMMUNITY)

## 2024-07-21 ENCOUNTER — Ambulatory Visit (HOSPITAL_COMMUNITY)

## 2024-07-28 ENCOUNTER — Ambulatory Visit (HOSPITAL_COMMUNITY)
